# Patient Record
Sex: Female | Born: 1935 | Race: White | Hispanic: No | Smoking: Never smoker
Health system: Southern US, Community
[De-identification: ages and names within clinical notes are randomized; demographics above are authoritative.]

## PROBLEM LIST (undated history)

## (undated) DIAGNOSIS — Z8679 Personal history of other diseases of the circulatory system: Secondary | ICD-10-CM

## (undated) DIAGNOSIS — E663 Overweight: Secondary | ICD-10-CM

## (undated) DIAGNOSIS — I5032 Chronic diastolic (congestive) heart failure: Secondary | ICD-10-CM

## (undated) DIAGNOSIS — R42 Dizziness and giddiness: Secondary | ICD-10-CM

## (undated) DIAGNOSIS — R0609 Other forms of dyspnea: Secondary | ICD-10-CM

## (undated) DIAGNOSIS — R0600 Dyspnea, unspecified: Secondary | ICD-10-CM

## (undated) DIAGNOSIS — Z9889 Other specified postprocedural states: Secondary | ICD-10-CM

## (undated) DIAGNOSIS — N189 Chronic kidney disease, unspecified: Secondary | ICD-10-CM

## (undated) DIAGNOSIS — I2510 Atherosclerotic heart disease of native coronary artery without angina pectoris: Secondary | ICD-10-CM

## (undated) DIAGNOSIS — M810 Age-related osteoporosis without current pathological fracture: Secondary | ICD-10-CM

## (undated) DIAGNOSIS — I639 Cerebral infarction, unspecified: Secondary | ICD-10-CM

## (undated) DIAGNOSIS — I4891 Unspecified atrial fibrillation: Secondary | ICD-10-CM

## (undated) DIAGNOSIS — I10 Essential (primary) hypertension: Secondary | ICD-10-CM

## (undated) DIAGNOSIS — S82143A Displaced bicondylar fracture of unspecified tibia, initial encounter for closed fracture: Secondary | ICD-10-CM

## (undated) HISTORY — DX: Essential (primary) hypertension: I10

## (undated) HISTORY — PX: TOTAL ABDOMINAL HYSTERECTOMY W/ BILATERAL SALPINGOOPHORECTOMY: SHX83

## (undated) HISTORY — DX: Atherosclerotic heart disease of native coronary artery without angina pectoris: I25.10

## (undated) HISTORY — DX: Chronic diastolic (congestive) heart failure: I50.32

## (undated) HISTORY — DX: Unspecified atrial fibrillation: I48.91

## (undated) HISTORY — DX: Other forms of dyspnea: R06.09

## (undated) HISTORY — PX: APPENDECTOMY: SHX54

## (undated) HISTORY — DX: Age-related osteoporosis without current pathological fracture: M81.0

## (undated) HISTORY — DX: Chronic kidney disease, unspecified: N18.9

## (undated) HISTORY — DX: Personal history of other diseases of the circulatory system: Z86.79

## (undated) HISTORY — DX: Overweight: E66.3

## (undated) HISTORY — DX: Dizziness and giddiness: R42

## (undated) HISTORY — DX: Dyspnea, unspecified: R06.00

## (undated) HISTORY — DX: Other specified postprocedural states: Z98.890

---

## 1998-05-13 ENCOUNTER — Encounter: Payer: Self-pay | Admitting: Cardiology

## 1998-05-13 ENCOUNTER — Ambulatory Visit (HOSPITAL_COMMUNITY): Admission: RE | Admit: 1998-05-13 | Discharge: 1998-05-13 | Payer: Self-pay | Admitting: Cardiology

## 1999-06-18 ENCOUNTER — Encounter: Admission: RE | Admit: 1999-06-18 | Discharge: 1999-06-18 | Payer: Self-pay | Admitting: Internal Medicine

## 1999-06-18 ENCOUNTER — Encounter: Payer: Self-pay | Admitting: Internal Medicine

## 1999-06-30 ENCOUNTER — Encounter: Payer: Self-pay | Admitting: Internal Medicine

## 1999-06-30 ENCOUNTER — Encounter: Admission: RE | Admit: 1999-06-30 | Discharge: 1999-06-30 | Payer: Self-pay | Admitting: Internal Medicine

## 2000-06-30 ENCOUNTER — Encounter: Admission: RE | Admit: 2000-06-30 | Discharge: 2000-06-30 | Payer: Self-pay | Admitting: Internal Medicine

## 2000-06-30 ENCOUNTER — Encounter: Payer: Self-pay | Admitting: Internal Medicine

## 2000-07-17 ENCOUNTER — Ambulatory Visit (HOSPITAL_COMMUNITY): Admission: RE | Admit: 2000-07-17 | Discharge: 2000-07-17 | Payer: Self-pay | Admitting: Internal Medicine

## 2000-07-17 ENCOUNTER — Encounter: Payer: Self-pay | Admitting: Internal Medicine

## 2000-08-16 ENCOUNTER — Ambulatory Visit (HOSPITAL_COMMUNITY): Admission: RE | Admit: 2000-08-16 | Discharge: 2000-08-16 | Payer: Self-pay | Admitting: Cardiology

## 2002-08-05 ENCOUNTER — Ambulatory Visit (HOSPITAL_COMMUNITY): Admission: RE | Admit: 2002-08-05 | Discharge: 2002-08-05 | Payer: Self-pay | Admitting: Cardiology

## 2007-01-02 ENCOUNTER — Encounter: Admission: RE | Admit: 2007-01-02 | Discharge: 2007-01-02 | Payer: Self-pay | Admitting: Family Medicine

## 2007-11-05 ENCOUNTER — Ambulatory Visit (HOSPITAL_COMMUNITY): Admission: RE | Admit: 2007-11-05 | Discharge: 2007-11-05 | Payer: Self-pay | Admitting: Cardiology

## 2007-11-27 ENCOUNTER — Ambulatory Visit (HOSPITAL_COMMUNITY): Admission: RE | Admit: 2007-11-27 | Discharge: 2007-11-27 | Payer: Self-pay | Admitting: Cardiology

## 2007-12-10 ENCOUNTER — Encounter: Admission: RE | Admit: 2007-12-10 | Discharge: 2007-12-10 | Payer: Self-pay | Admitting: Cardiology

## 2009-08-14 ENCOUNTER — Encounter: Admission: RE | Admit: 2009-08-14 | Discharge: 2009-08-14 | Payer: Self-pay | Admitting: Internal Medicine

## 2009-08-28 ENCOUNTER — Ambulatory Visit (HOSPITAL_COMMUNITY): Admission: RE | Admit: 2009-08-28 | Discharge: 2009-08-28 | Payer: Self-pay | Admitting: Cardiology

## 2009-09-01 ENCOUNTER — Ambulatory Visit: Payer: Self-pay | Admitting: Thoracic Surgery (Cardiothoracic Vascular Surgery)

## 2009-09-04 ENCOUNTER — Ambulatory Visit (HOSPITAL_COMMUNITY): Admission: RE | Admit: 2009-09-04 | Discharge: 2009-09-04 | Payer: Self-pay | Admitting: Cardiology

## 2009-09-04 ENCOUNTER — Encounter (INDEPENDENT_AMBULATORY_CARE_PROVIDER_SITE_OTHER): Payer: Self-pay | Admitting: Cardiology

## 2009-09-21 ENCOUNTER — Encounter
Admission: RE | Admit: 2009-09-21 | Discharge: 2009-09-21 | Payer: Self-pay | Admitting: Thoracic Surgery (Cardiothoracic Vascular Surgery)

## 2009-09-21 ENCOUNTER — Ambulatory Visit: Payer: Self-pay | Admitting: Thoracic Surgery (Cardiothoracic Vascular Surgery)

## 2009-09-25 ENCOUNTER — Ambulatory Visit: Payer: Self-pay | Admitting: Vascular Surgery

## 2009-10-05 ENCOUNTER — Encounter: Payer: Self-pay | Admitting: Thoracic Surgery (Cardiothoracic Vascular Surgery)

## 2009-10-06 ENCOUNTER — Ambulatory Visit: Payer: Self-pay | Admitting: Thoracic Surgery (Cardiothoracic Vascular Surgery)

## 2009-10-07 ENCOUNTER — Inpatient Hospital Stay (HOSPITAL_COMMUNITY)
Admission: RE | Admit: 2009-10-07 | Discharge: 2009-10-13 | Payer: Self-pay | Admitting: Thoracic Surgery (Cardiothoracic Vascular Surgery)

## 2009-10-07 ENCOUNTER — Ambulatory Visit: Payer: Self-pay | Admitting: Thoracic Surgery (Cardiothoracic Vascular Surgery)

## 2009-10-07 DIAGNOSIS — Z9889 Other specified postprocedural states: Secondary | ICD-10-CM

## 2009-10-07 DIAGNOSIS — Z8679 Personal history of other diseases of the circulatory system: Secondary | ICD-10-CM | POA: Insufficient documentation

## 2009-10-07 HISTORY — DX: Personal history of other diseases of the circulatory system: Z86.79

## 2009-10-07 HISTORY — DX: Other specified postprocedural states: Z98.890

## 2009-10-07 HISTORY — PX: OTHER SURGICAL HISTORY: SHX169

## 2009-10-16 ENCOUNTER — Ambulatory Visit: Payer: Self-pay | Admitting: Thoracic Surgery (Cardiothoracic Vascular Surgery)

## 2009-10-26 ENCOUNTER — Encounter: Admission: RE | Admit: 2009-10-26 | Discharge: 2009-10-26 | Payer: Self-pay | Admitting: Endocrinology

## 2009-10-26 ENCOUNTER — Ambulatory Visit: Payer: Self-pay | Admitting: Thoracic Surgery (Cardiothoracic Vascular Surgery)

## 2009-11-09 ENCOUNTER — Ambulatory Visit: Payer: Self-pay | Admitting: Thoracic Surgery (Cardiothoracic Vascular Surgery)

## 2009-11-12 ENCOUNTER — Encounter
Admission: RE | Admit: 2009-11-12 | Discharge: 2009-11-12 | Payer: Self-pay | Admitting: Thoracic Surgery (Cardiothoracic Vascular Surgery)

## 2009-11-16 ENCOUNTER — Ambulatory Visit: Payer: Self-pay | Admitting: Thoracic Surgery (Cardiothoracic Vascular Surgery)

## 2009-11-26 ENCOUNTER — Encounter
Admission: RE | Admit: 2009-11-26 | Discharge: 2009-11-26 | Payer: Self-pay | Admitting: Thoracic Surgery (Cardiothoracic Vascular Surgery)

## 2009-12-07 ENCOUNTER — Ambulatory Visit: Payer: Self-pay | Admitting: Thoracic Surgery (Cardiothoracic Vascular Surgery)

## 2010-03-22 ENCOUNTER — Ambulatory Visit: Payer: Self-pay | Admitting: Thoracic Surgery (Cardiothoracic Vascular Surgery)

## 2010-08-22 ENCOUNTER — Encounter: Payer: Self-pay | Admitting: Thoracic Surgery (Cardiothoracic Vascular Surgery)

## 2010-09-20 ENCOUNTER — Encounter: Payer: Self-pay | Admitting: Thoracic Surgery (Cardiothoracic Vascular Surgery)

## 2010-09-27 ENCOUNTER — Encounter (INDEPENDENT_AMBULATORY_CARE_PROVIDER_SITE_OTHER): Payer: Medicare PPO | Admitting: Thoracic Surgery (Cardiothoracic Vascular Surgery)

## 2010-09-27 DIAGNOSIS — I4891 Unspecified atrial fibrillation: Secondary | ICD-10-CM

## 2010-09-27 DIAGNOSIS — I059 Rheumatic mitral valve disease, unspecified: Secondary | ICD-10-CM

## 2010-09-27 NOTE — Assessment & Plan Note (Signed)
OFFICE VISIT  MINSA, WEDDINGTON DOB:  [birth year]                                        September 27, 2010 CHART #:  [id number]  HISTORY OF PRESENT ILLNESS:  The patient returns for routine followup now just shy of 1 year status post mitral valve repair and maze procedure on October 07, 2009.  She was last seen here in the office on March 22, 2010.  Since then, the patient has continued to do quite well.  She has been actively participating in the cardiac rehab program and getting along very nicely.  She has mild exertional shortness of breath which has gradually improved with her improved exercise tolerance.  She has not had any tachy palpitations or dizzy spells.  She has not had any chest pain.  She has not had any cardiac related issues to her knowledge whatsoever.  She is scheduled to see Dr. Anne Fu in followup sometime in March.  To her knowledge, she has not had a followup echocardiogram performed in quite some time.  Overall, she seems to be getting along quite nicely and she has no complaints.  CURRENT MEDICATIONS:  Aldactone, Lasix, aspirin, Diovan, potassium chloride, Bystolic, amlodipine, pravastatin, and fish oil capsules.  PHYSICAL EXAMINATION:  GENERAL:  A well-appearing elderly female.  VITAL SIGNS:  Blood pressure 151/79, pulse 78 and regular.  Two-channel telemetry rhythm strip demonstrates normal sinus rhythm.  CHEST: Auscultation of the chest reveals clear breath sounds that are symmetrical bilaterally.  No wheezes, rales, or rhonchi noted. CARDIOVASCULAR:  Notable for regular rate and rhythm.  No murmurs, rubs, or gallops are appreciated.  ABDOMEN:  Soft and nontender.  EXTREMITIES: Warm and well perfused.  There is mild bilateral lower extremity edema which is chronic.  IMPRESSION:  The patient seems to be doing well approximately 1 year following mitral valve repair and maze procedure.  She is maintaining sinus rhythm and clinically  doing nicely.  PLAN:  We will plan to see the patient back in 1 year's time for routine followup and rhythm check.  Her blood pressure is slightly elevated today, but we will not address this and allow this to be reevaluated by Dr. Valentina Lucks, Dr. Charm Barges, and/or Dr. Anne Fu depending upon whether or not her blood pressure stays elevated.  It apparently has been not elevated one that has been checked at the cardiac rehab program.  All of her questions have been addressed.  Salvatore Decent. Cornelius Moras, M.D. Electronically Signed  CHO/MEDQ  D:  09/27/2010  T:  09/27/2010  Job:  [id number]  cc:   Thora Lance, M.D. Leilani Merl, MD

## 2010-10-18 LAB — POCT I-STAT 3, VENOUS BLOOD GAS (G3P V)
Acid-base deficit: 1 mmol/L (ref 0.0–2.0)
Bicarbonate: 22 mEq/L (ref 20.0–24.0)
Bicarbonate: 24.8 mEq/L — ABNORMAL HIGH (ref 20.0–24.0)
O2 Saturation: 53 %
O2 Saturation: 62 %
O2 Saturation: 64 %
pCO2, Ven: 40.1 mmHg — ABNORMAL LOW (ref 45.0–50.0)
pCO2, Ven: 41.6 mmHg — ABNORMAL LOW (ref 45.0–50.0)
pH, Ven: 7.374 — ABNORMAL HIGH (ref 7.250–7.300)
pH, Ven: 7.379 — ABNORMAL HIGH (ref 7.250–7.300)
pH, Ven: 7.393 — ABNORMAL HIGH (ref 7.250–7.300)
pO2, Ven: 33 mmHg (ref 30.0–45.0)
pO2, Ven: 33 mmHg (ref 30.0–45.0)

## 2010-10-18 LAB — POCT I-STAT 3, ART BLOOD GAS (G3+)
O2 Saturation: 92 %
pH, Arterial: 7.408 — ABNORMAL HIGH (ref 7.350–7.400)
pO2, Arterial: 64 mmHg — ABNORMAL LOW (ref 80.0–100.0)

## 2010-10-18 LAB — PROTIME-INR: INR: 1.65 — ABNORMAL HIGH (ref 0.00–1.49)

## 2010-10-25 LAB — COMPREHENSIVE METABOLIC PANEL
ALT: 15 U/L (ref 0–35)
AST: 18 U/L (ref 0–37)
Alkaline Phosphatase: 91 U/L (ref 39–117)
CO2: 21 mEq/L (ref 19–32)
Chloride: 113 mEq/L — ABNORMAL HIGH (ref 96–112)
GFR calc Af Amer: >60 mL/min (ref >60)
GFR calc non Af Amer: 52 mL/min — ABNORMAL LOW (ref >60)
Glucose, Bld: 104 mg/dL — ABNORMAL HIGH (ref 70–99)
Potassium: 4.5 mEq/L (ref 3.5–5.1)
Sodium: 143 mEq/L (ref 135–145)
Total Bilirubin: 1.1 mg/dL (ref 0.3–1.2)

## 2010-10-25 LAB — CBC
HCT: 31.4 % — ABNORMAL LOW (ref 36.0–46.0)
HCT: 32.1 % — ABNORMAL LOW (ref 36.0–46.0)
HCT: 34.2 % — ABNORMAL LOW (ref 36.0–46.0)
HCT: 35.4 % — ABNORMAL LOW (ref 36.0–46.0)
Hemoglobin: 10.6 g/dL — ABNORMAL LOW (ref 12.0–15.0)
Hemoglobin: 10.8 g/dL — ABNORMAL LOW (ref 12.0–15.0)
Hemoglobin: 11.6 g/dL — ABNORMAL LOW (ref 12.0–15.0)
Hemoglobin: 11.9 g/dL — ABNORMAL LOW (ref 12.0–15.0)
Hemoglobin: 14.3 g/dL (ref 12.0–15.0)
MCHC: 33.6 g/dL (ref 30.0–36.0)
MCHC: 33.7 g/dL (ref 30.0–36.0)
MCHC: 33.8 g/dL (ref 30.0–36.0)
MCV: 88.2 fL (ref 78.0–100.0)
MCV: 88.3 fL (ref 78.0–100.0)
MCV: 88.9 fL (ref 78.0–100.0)
MCV: 89.2 fL (ref 78.0–100.0)
Platelets: 178 10*3/uL (ref 150–400)
Platelets: 81 10*3/uL — ABNORMAL LOW (ref 150–400)
Platelets: 85 10*3/uL — ABNORMAL LOW (ref 150–400)
Platelets: 98 10*3/uL — ABNORMAL LOW (ref 150–400)
RBC: 3.53 MIL/uL — ABNORMAL LOW (ref 3.87–5.11)
RBC: 3.59 MIL/uL — ABNORMAL LOW (ref 3.87–5.11)
RBC: 3.83 MIL/uL — ABNORMAL LOW (ref 3.87–5.11)
RBC: 4.01 MIL/uL (ref 3.87–5.11)
RBC: 4.93 MIL/uL (ref 3.87–5.11)
RDW: 15.1 % (ref 11.5–15.5)
RDW: 15.4 % (ref 11.5–15.5)
RDW: 15.9 % — ABNORMAL HIGH (ref 11.5–15.5)
WBC: 11 10*3/uL — ABNORMAL HIGH (ref 4.0–10.5)
WBC: 13.9 10*3/uL — ABNORMAL HIGH (ref 4.0–10.5)
WBC: 14.6 10*3/uL — ABNORMAL HIGH (ref 4.0–10.5)
WBC: 19.1 10*3/uL — ABNORMAL HIGH (ref 4.0–10.5)
WBC: 20.6 10*3/uL — ABNORMAL HIGH (ref 4.0–10.5)
WBC: 8.9 10*3/uL (ref 4.0–10.5)

## 2010-10-25 LAB — BASIC METABOLIC PANEL
BUN: 15 mg/dL (ref 6–23)
BUN: 21 mg/dL (ref 6–23)
CO2: 23 mEq/L (ref 19–32)
CO2: 30 mEq/L (ref 19–32)
Calcium: 7.7 mg/dL — ABNORMAL LOW (ref 8.4–10.5)
Chloride: 101 mEq/L (ref 96–112)
Chloride: 109 mEq/L (ref 96–112)
Chloride: 117 mEq/L — ABNORMAL HIGH (ref 96–112)
Creatinine, Ser: 0.96 mg/dL (ref 0.4–1.2)
Creatinine, Ser: 1.32 mg/dL — ABNORMAL HIGH (ref 0.4–1.2)
Creatinine, Ser: 1.39 mg/dL — ABNORMAL HIGH (ref 0.4–1.2)
GFR calc Af Amer: 39 mL/min — ABNORMAL LOW (ref >60)
GFR calc Af Amer: 47 mL/min — ABNORMAL LOW (ref >60)
GFR calc Af Amer: >60 mL/min (ref >60)
GFR calc Af Amer: >60 mL/min (ref >60)
GFR calc non Af Amer: 32 mL/min — ABNORMAL LOW (ref >60)
GFR calc non Af Amer: 37 mL/min — ABNORMAL LOW (ref >60)
GFR calc non Af Amer: 57 mL/min — ABNORMAL LOW (ref >60)
Glucose, Bld: 102 mg/dL — ABNORMAL HIGH (ref 70–99)
Glucose, Bld: 106 mg/dL — ABNORMAL HIGH (ref 70–99)
Potassium: 3.6 mEq/L (ref 3.5–5.1)
Potassium: 3.7 mEq/L (ref 3.5–5.1)
Potassium: 3.7 mEq/L (ref 3.5–5.1)
Potassium: 4.7 mEq/L (ref 3.5–5.1)
Sodium: 136 mEq/L (ref 135–145)
Sodium: 141 mEq/L (ref 135–145)
Sodium: 142 mEq/L (ref 135–145)
Sodium: 145 mEq/L (ref 135–145)

## 2010-10-25 LAB — POCT I-STAT 3, ART BLOOD GAS (G3+)
Acid-base deficit: 2 mmol/L (ref 0.0–2.0)
Acid-base deficit: 3 mmol/L — ABNORMAL HIGH (ref 0.0–2.0)
Acid-base deficit: 4 mmol/L — ABNORMAL HIGH (ref 0.0–2.0)
Acid-base deficit: 5 mmol/L — ABNORMAL HIGH (ref 0.0–2.0)
Bicarbonate: 23.1 mEq/L (ref 20.0–24.0)
O2 Saturation: 100 %
O2 Saturation: 96 %
Patient temperature: 36.8
Patient temperature: 36.9
TCO2: 23 mmol/L (ref 0–100)
pCO2 arterial: 35 mmHg (ref 35.0–45.0)
pCO2 arterial: 37.9 mmHg (ref 35.0–45.0)

## 2010-10-25 LAB — POCT I-STAT 4, (NA,K, GLUC, HGB,HCT)
Glucose, Bld: 105 mg/dL — ABNORMAL HIGH (ref 70–99)
Glucose, Bld: 107 mg/dL — ABNORMAL HIGH (ref 70–99)
Glucose, Bld: 88 mg/dL (ref 70–99)
HCT: 26 % — ABNORMAL LOW (ref 36.0–46.0)
HCT: 26 % — ABNORMAL LOW (ref 36.0–46.0)
HCT: 26 % — ABNORMAL LOW (ref 36.0–46.0)
HCT: 33 % — ABNORMAL LOW (ref 36.0–46.0)
HCT: 34 % — ABNORMAL LOW (ref 36.0–46.0)
Hemoglobin: 11.2 g/dL — ABNORMAL LOW (ref 12.0–15.0)
Hemoglobin: 11.6 g/dL — ABNORMAL LOW (ref 12.0–15.0)
Hemoglobin: 8.8 g/dL — ABNORMAL LOW (ref 12.0–15.0)
Hemoglobin: 8.8 g/dL — ABNORMAL LOW (ref 12.0–15.0)
Potassium: 3.7 mEq/L (ref 3.5–5.1)
Potassium: 3.7 mEq/L (ref 3.5–5.1)
Potassium: 3.8 mEq/L (ref 3.5–5.1)
Sodium: 139 mEq/L (ref 135–145)
Sodium: 142 mEq/L (ref 135–145)
Sodium: 143 mEq/L (ref 135–145)

## 2010-10-25 LAB — GLUCOSE, CAPILLARY
Glucose-Capillary: 112 mg/dL — ABNORMAL HIGH (ref 70–99)
Glucose-Capillary: 114 mg/dL — ABNORMAL HIGH (ref 70–99)
Glucose-Capillary: 135 mg/dL — ABNORMAL HIGH (ref 70–99)
Glucose-Capillary: 149 mg/dL — ABNORMAL HIGH (ref 70–99)
Glucose-Capillary: 160 mg/dL — ABNORMAL HIGH (ref 70–99)
Glucose-Capillary: 96 mg/dL (ref 70–99)

## 2010-10-25 LAB — MAGNESIUM: Magnesium: 2.4 mg/dL (ref 1.5–2.5)

## 2010-10-25 LAB — POCT I-STAT GLUCOSE
Glucose, Bld: 92 mg/dL (ref 70–99)
Operator id: 3406

## 2010-10-25 LAB — URINALYSIS, ROUTINE W REFLEX MICROSCOPIC
Glucose, UA: NEGATIVE mg/dL
Ketones, ur: NEGATIVE mg/dL
Nitrite: POSITIVE — AB
pH: 6 (ref 5.0–8.0)

## 2010-10-25 LAB — POCT I-STAT, CHEM 8
BUN: 20 mg/dL (ref 6–23)
Calcium, Ion: 1.08 mmol/L — ABNORMAL LOW (ref 1.12–1.32)
HCT: 35 % — ABNORMAL LOW (ref 36.0–46.0)
TCO2: 22 mmol/L (ref 0–100)

## 2010-10-25 LAB — URINE MICROSCOPIC-ADD ON

## 2010-10-25 LAB — TYPE AND SCREEN: Antibody Screen: NEGATIVE

## 2010-10-25 LAB — PROTIME-INR
INR: 1.6 — ABNORMAL HIGH (ref 0.00–1.49)
INR: 1.64 — ABNORMAL HIGH (ref 0.00–1.49)
INR: 2.01 — ABNORMAL HIGH (ref 0.00–1.49)
Prothrombin Time: 19.3 seconds — ABNORMAL HIGH (ref 11.6–15.2)
Prothrombin Time: 20.1 seconds — ABNORMAL HIGH (ref 11.6–15.2)

## 2010-10-25 LAB — BLOOD GAS, ARTERIAL
Bicarbonate: 21.8 mEq/L (ref 20.0–24.0)
FIO2: 0.21 %
O2 Saturation: 93.4 %
pCO2 arterial: 33.3 mmHg — ABNORMAL LOW (ref 35.0–45.0)
pO2, Arterial: 76.1 mmHg — ABNORMAL LOW (ref 80.0–100.0)

## 2010-10-25 LAB — SURGICAL PCR SCREEN: MRSA, PCR: NEGATIVE

## 2010-10-25 LAB — APTT: aPTT: 39 seconds — ABNORMAL HIGH (ref 24–37)

## 2010-10-25 LAB — ABO/RH: ABO/RH(D): A POS

## 2010-10-25 LAB — CREATININE, SERUM: GFR calc Af Amer: >60 mL/min (ref >60)

## 2010-10-25 LAB — PLATELET COUNT: Platelets: 128 10*3/uL — ABNORMAL LOW (ref 150–400)

## 2010-10-25 LAB — HEMOGLOBIN A1C: Hgb A1c MFr Bld: 5.7 % (ref 4.6–6.1)

## 2010-10-25 LAB — MRSA PCR SCREENING: MRSA by PCR: NEGATIVE

## 2010-12-14 NOTE — Procedures (Signed)
VASCULAR LAB EXAM   INDICATION:  Left groin AVF.   HISTORY:  Diabetes:  No.  Cardiac:  MI.  Hypertension:  Yes.   EXAM:  Left groin duplex.   IMPRESSION:  1. Evidence of left AVF (arteriovenous fistula) that appears to      connect proximal superficial femoral artery to common femoral vein.  2. Elevated velocities of >600 cm/s at the site of AVF.  3. Arterial and venous flow appear within normal limits proximal and      distal to the location of the AVF.   ___________________________________________  Larina Earthly, M.D.   AS/MEDQ  D:  09/25/2009  T:  09/25/2009  Job:  [id number]

## 2010-12-14 NOTE — Assessment & Plan Note (Signed)
OFFICE VISIT   Tami Ramirez, Tami Ramirez  DOB:  Mar 22, 1929                                        October 16, 2009  CHART #:  [id number]   HISTORY OF PRESENT ILLNESS:  The patient presents to the office today  for followup status post right miniature thoracotomy for mitral valve  repair with Cox-CryoMaze procedure and ligation of left femoral AV  fistula on October 07, 2009.  Her postoperative recovery in the hospital  was remarkably uncomplicated, and she was discharged to a skilled  nursing facility last week.  She was scheduled to see [place] in the office  this coming Monday, but she was brought in today due to concerns  regarding her left groin incision.  She reportedly is doing reasonably  well at the nursing facility.  She states that they are getting her up  to walk her at least twice a day.  She still does not feel terribly well  and she still has some soreness in her chest and her left groin.  She  reportedly had a low grade fever of 99 degrees yesterday.  She has never  had any fever higher than that.  She has still been eating alright.  Her  bowel function is normal.  She is sleeping fairly well at night.  She  denies any cough.  She has not had any problems breathing.  Her  prothrombin time has been drawn and checked and reportedly her INR was  therapeutic.  Medications remain unchanged from the time of hospital  discharge.   PHYSICAL EXAMINATION:  Notable for well-appearing elderly female with  blood pressure 108/63, pulse 72 and regular, temperature 98.4 degrees.  Examination of the chest reveals a mini thoracotomy incision that is  healing nicely.  Her chest tube sutures are removed.  Auscultation of  the chest reveals clear breath sounds which are overall fairly  symmetrical bilaterally.  No wheezes, rales, or rhonchi are noted.  Cardiovascular exam demonstrates regular rate and rhythm.  No murmurs,  rubs, or gallops are appreciated.  The abdomen is soft,  nontender.  The  extremities are warm and well perfused.  The left groin incision is  mildly erythematous but overall intact.  On palpation, there is mild  tenderness and a very small amount of cloudy drainage can be expressed  medially.  There is no fluctuance on exam to suggest a soft tissue fluid  collection.  The drainage is cultured with a routine wound swab and a  dry dressing was applied after cleansing the wound.   IMPRESSION:  Overall, I think the patient looks good.  She may be  developing early signs of a left groin wound infection, but overall the  wound appears intact and there is certainly no indication that it needs  to be a opened or debrided.  Otherwise, she seems to be doing fairly  well.   PLAN:  I have given the patient a prescription for Keflex 500 mg p.o.  three times daily.  We will follow up on her wound swab culture early  next week.  We will make sure that they are cleansing her wound daily at  the nursing facility.  We have continued to encourage increased physical  activity.  We will plan to see her back on Monday, March 28, with a  chest x-ray.  Her family has been instructed to bring her back sooner if  she has any further problems or if her groin wound becomes more  erythematous or starts draining.   Salvatore Decent. Cornelius Moras, M.D.  Electronically Signed   CHO/MEDQ  D:  10/16/2009  T:  10/17/2009  Job:  [id number]   cc:   Thora Lance, M.D.  Corliss Marcus, MD

## 2010-12-14 NOTE — Assessment & Plan Note (Signed)
OFFICE VISIT   Tami Ramirez, Tami Ramirez  DOB:  [birth year]                                        March 22, 2010  CHART #:  [id number]   HISTORY OF PRESENT ILLNESS:  The patient returns for routine followup  and rhythm check status post right miniature thoracotomy for mitral  valve repair and Cox CryoMaze procedure on October 07, 2009.  She was last  seen here in the office on Dec 07, 2009.  Since then, she has continued  to do very well.  She was seen in followup by Dr. Amil Amen on February 24, 2010.  She was taken off of amiodarone at that time and she has  continued to do very well since then.  She returns to our office for  further followup today.  She reports that her breathing remains much  better than it was prior to her surgery.  She still gets short of breath  with activity, but much less than she did prior to surgery.  She has not  had any exertional chest pain.  She still has some soreness in her right  lateral chest wall related to her mini thoracotomy.  This is gradually  improving.  She previously had numbness and pain involving her left hand  and forearm.  These symptoms have now completely resolved with exception  of a very mild amount of numbness involving her left thumb.  She  otherwise feels well.  She admits that she does not exercise as much as  she should be, but she is getting around without any significant  limitation.  She is quite stable overall and has not had any new medical  problems or issues for the last few months.  The remainder of her review  of systems is unremarkable.   CURRENT MEDICATIONS:  Bystolic, aspirin, warfarin, Lasix, potassium  chloride, amlodipine, pravastatin, and omega-3 fatty acid complex.   PHYSICAL EXAMINATION:  Notable for a well-appearing, moderately obese  elderly female with blood pressure 151/79, pulse 78 and regular, and  oxygen saturation 93% on room air.  Two-channel telemetry rhythm strip  demonstrates normal  sinus rhythm.  Examination of the chest reveals  clear breath sounds that are symmetrical bilaterally.  No wheezes,  rales, or rhonchi are noted.  Cardiovascular exam is notable for regular  rate and rhythm.  No murmurs, rubs, or gallops are noted.  The abdomen  is soft and nontender.  The left groin incision healed completely.  There is moderate bilateral lower extremity edema that is stable and  chronic.  No other abnormalities are noted.   IMPRESSION:  The patient appears to be doing well and maintaining sinus  rhythm.   PLAN:  I think if she continues to remain in sinus rhythm for the next 6  weeks, it would be reasonable to consider stopping Coumadin now that she  has been taken off of amiodarone.  A followup CardioNet monitor or  Holter monitor might be helpful to confirm the absence of any  significant episodes of atrial fibrillation, but clinically she is doing  very well.  We have not made any changes in her medications and we will  defer any subsequent decisions to Dr. Amil Amen and colleagues.  We will  plan to see the patient back for further followup and rhythm check in 6  months.  Salvatore Decent. Cornelius Moras, M.D.  Electronically Signed   CHO/MEDQ  D:  03/22/2010  T:  03/23/2010  Job:  [id number]   cc:   Francisca December, M.D.  Thora Lance, M.D.  Melvyn Novas, M.D.

## 2010-12-14 NOTE — Assessment & Plan Note (Signed)
OFFICE VISIT   Tami, Ramirez  DOB:  1928/08/18                                        November 16, 2009  CHART #:  [id number]   HISTORY:  The patient returns to the office today for further follow up.  She was last seen here in the office on November 09, 2009.  At that time,  she was reporting numbness involving her left hand and forearm.  We  ordered MRI and MRA of the brain to rule out a possible stroke.  Unfortunately, only an MRA was performed and an MRI was not performed.  The MRA did not reveal any high-grade intracranial arterial stenosis.  Last week, we also noted that she had a small open wound in her groin  and we ordered daily home health nursing visits for wound care.  The  patient and her daughter report that the home health agency told them  that they could not come out every day.  They came out this morning, but  the last time prior to that was last Thursday.  The patient is otherwise  doing fairly well.  She does not bring her medications with her to the  office and she does not carry a list of her current medications.  She  has been seen in follow up both by Dr. Valentina Lucks and Dr. Amil Amen.  Apparently, her blood pressure medications have been changed and she has  been given a new medication with some samples.  The remainder of her  review of systems is unremarkable.   PHYSICAL EXAMINATION:  Notable for a well-appearing female.  Blood  pressure 191/92 and on repeat is 174/88.  Pulse is 84 and regular,  oxygen saturation 92% on room air.  Auscultation of the chest reveals  clear breath sounds.  Cardiovascular exam is notable for the absence of  any murmur.  The left groin incision is clean and granulating nicely.  The wound is small and overall healing up fine.  There is mild lower  extremity edema.   ASSESSMENT AND PLAN:  We will again try to obtain MRI of the brain to  rule out stroke.  We will go ahead and ask for neurology consult so that  she can have an appointment to be seen by a neurologist once her MRI has  been completed.  She will need physical therapy and occupational therapy  because of her left hand numbness.  We will make sure that she is  getting daily wound care for her groin wound.  We will plan to see her  back in 3 weeks.  We have not made any changes in her current  medications, but we have instructed her and her daughter to  keep an eye on her blood pressure and make sure to keep a list of her  current medications with her at all time.   Salvatore Decent. Cornelius Moras, M.D.  Electronically Signed   CHO/MEDQ  D:  11/16/2009  T:  11/17/2009  Job:  [id number]   cc:   Francisca December, M.D.  Thora Lance, M.D.

## 2010-12-14 NOTE — Assessment & Plan Note (Signed)
OFFICE VISIT   YAN, OKRAY  DOB:  01/16/1929                                        November 09, 2009  CHART #:  [id number]   REASON FOR OFFICE VISIT:  Left groin wound check.   HISTORY OF PRESENTING ILLNESS:  The patient is status post right  miniature thoracotomy for mitral valve repair, Cox CryoMaze procedure  (left side), and ligation of left femoral AV fistula by Dr. Cornelius Moras on  October 07, 2009.  The patient was last seen in the office on October 26, 2009.  At that time, she continued with a small amount of intermittent  although decreasing drainage from the left groin.  She had previously  been started on Keflex on October 16, 2009, and was given a 2-week  prescription on this last office visit.  She is almost finished with the  2-week treatment.  She is accompanied to her office visit by her  daughter.  Her other complaint is minimal residual soreness on her right  chest.  She denies any shortness of breath, nauseousness, vomiting,  fever, or chills.  She is recently discharged from the facility and is  now at home and home health just initiated their visit today.  She had  her PT and INR drawn today, the results are apparently going to be faxed  to Dr. Amil Amen' office who will continue to adjust her Coumadin  accordingly.  The patient states that the home health nurse did not look  at the left groin as she did not mention it to them.  The patient also  reports numbness of her left arm, hand, and all 5 fingers.  She is  uncertain as to when this exactly started but it is fairly recent.  She  denies any difficulties with speech, any visual changes, or difficulties  with vision.  Denies any lower extremity numbness.   PHYSICAL EXAMINATION:  General:  This is a pleasant Caucasian female who  is in no acute distress who is alert, oriented, and cooperative.  Her  latest vital signs are as follows:  BP 153/80, pulse rate 78,  respirations 18, O2 sat 91% on room  air.  Right chest wound is clean,  dry, and well healed.  No sign of infection.  Left groin wound, a small  piece of stitch was removed from the corner of the wound.  There is a  tiny area of the left groin that is open.  There was some fluffy tissue,  this was gently debrided.  A Q-tip was used to probe the depth of the  wound.  It is not very deep, more superficial (no real surrounding  erythema, no fluctuance or drainage upon palpation).  Neurologic:  Cranial nerves II through XII are grossly intact without any focal  deficits.  The patient does have complaints as previously stated of  numbness and tingling down her entire left arm, fingers, and hand.  No  obvious weakness from her right upper extremity.  No lower extremity  weakness bilaterally.   IMPRESSION AND PLAN:  We are going to contact home health to arrange for  normal saline damp-to-dry dressing to the left groin wound to be changed  daily.  She was instructed to finish her Keflex and at this time  requires no further antibiotics.  She was instructed to  contact the  office if she develops any redness, drainage, fever, or chills.  She is  going to return to see Dr. Cornelius Moras in 1 week for follow up.  As previously  stated, the patient's PT and INR were continued to be monitored by Dr.  Amil Amen' office.  Finally, regarding the patient's new left-sided  numbness and tingling of the left upper extremity, we are going to  obtain an MRI/MRA of the brain to rule out CVA.  The patient was  instructed she is not to participate in outpatient rehab or drive until  instructed otherwise.  The patient was seen and evaluated by both myself  and Dr. Cornelius Moras.   Salvatore Decent. Cornelius Moras, M.D.  Electronically Signed   DZ/MEDQ  D:  11/09/2009  T:  11/10/2009  Job:  [id number]   cc:   Francisca December, M.D.  Thora Lance, M.D.

## 2010-12-14 NOTE — H&P (Signed)
HISTORY AND PHYSICAL EXAMINATION   September 01, 2009   Re:  Tami Ramirez, Tami Ramirez         DOB:  1929-06-12   REQUESTING PHYSICIAN:  Francisca December, MD   PRESENTING CHIEF COMPLAINT:  Shortness of breath.   HISTORY OF PRESENT ILLNESS:  The patient is a 77 year old widowed white  female from [place], [place], with long-standing history of  progressive symptoms of congestive heart failure and recently discovered  severe mitral regurgitation and atrial fibrillation.  The patient's  cardiac history dates back to 1997 when she suffered an anterior wall  myocardial infarction.  She was treated with percutaneous coronary  intervention and stent placement in the left anterior descending  coronary artery.  She has been followed by Dr. Corliss Marcus for many  years.  For the last several years, she had developed gradual  progression of symptoms of exertional shortness of breath and bilateral  lower extremity edema.  She underwent followup cardiac catheterization  in April 2009 demonstrating patent stent in the left anterior descending  coronary artery with otherwise nonobstructive coronary artery disease.  Overall, left ventricular ejection fraction was estimated 60%, although  there was focal apical dyskinesis consistent with the patient's remote  history of anteroapical myocardial infarction.  She was treated  medically.  The patient states that over the last several months she has  had a fairly rapid decline in her functional status with progressive  exertional shortness of breath with intervening periods of resting  shortness of breath as well as orthopnea and bilateral lower extremity  edema.  She returned to see Dr. Amil Amen and was noted to be in atrial  fibrillation this past week.  A followup echocardiogram was performed in  his office on August 14, 2009.  There was severe mitral regurgitation  with severe left atrial enlargement.  Left ventricular ejection  fraction  was estimated 45-50%.  There was moderate tricuspid regurgitation as  well as significant diastolic dysfunction related to left ventricular  hypertrophy.  The patient subsequently underwent left and right heart  catheterization by Dr. Amil Amen on August 28, 2009.  The stent in the  left anterior descending coronary artery remained widely patent and  there was otherwise no significant flow-limiting coronary artery disease  appreciated.  There was significant pulmonary hypertension with PA  pressures measured 46/27, pulmonary capillary wedge pressure of 26 with  a large V-wave.  Left ventricular end-diastolic pressure was 21.  Cardiac output was measured at 3.7 L per minute corresponding to a  cardiac index of 2.2.  Central venous pressure was 14.  Left  ventriculogram demonstrated mild left ventricular chamber dilatation  with significant anterior apical akinesis or dyskinesis.  Ejection  fraction was estimated 30-35%.  There was severe mitral regurgitation.  The patient has been referred to consider elective mitral valve repair.   REVIEW OF SYSTEMS:  GENERAL:  The patient reports normal appetite.  She  has not been gaining nor losing weight recently.  She is 5 feet 2 inches  tall and weighs approximately 147 pounds.  CARDIAC:  Notable for progressive symptoms of exertional shortness of  breath and fatigue.  The patient has orthopnea and sleeps on at least 2  pillows at night.  She has had severe bilateral lower extremity edema,  although this has improved over the last couple weeks on increased  diuretic therapy.  She has had some chest tightness which seems to come  and go and is not associated with physical exertion.  She has not had  any dizzy spells or syncopal episodes.  RESPIRATORY:  Notable for shortness of breath.  The patient denies  productive cough, hemoptysis, wheezing.  She has been started on oxygen  therapy at home since her heart catheterization and this seems  to help  her symptomatically.  GASTROINTESTINAL:  Negative.  The patient has no difficulty swallowing.  She denies hematochezia, hematemesis, melena.  Bowel function is  regular.  GENITOURINARY:  Negative.  The patient denies urinary urgency or  dysuria.  PERIPHERAL VASCULAR:  Negative.  The patient denies symptoms suggestive  of claudication.  NEUROLOGIC:  Negative.  The patient denies symptoms suggestive of  previous stroke.  MUSCULOSKELETAL:  Notable for mild arthralgias, but overall the patient  is not limited physically.  PSYCHIATRIC:  Negative.  HEENT:  Negative.  INFECTIOUS:  Negative.   PAST MEDICAL HISTORY:  1. Mitral regurgitation, recently diagnosed.  2. Congestive heart failure, chronic systolic and diastolic.  3. Coronary artery disease with ischemic cardiomyopathy, status post      acute anterior-apical myocardial infarction in 1997.  4. Hypertension.  5. Atrial fibrillation, recent onset, paroxysmal.   PAST SURGICAL HISTORY:  1. Hysterectomy.  2. Appendectomy.   FAMILY HISTORY:  Noncontributory.   SOCIAL HISTORY:  The patient is widowed and been living alone for many  years.  She has 5 grown children and numerous grandchildren and her  family is very supportive.  She has remained active physically, care for  herself, drives an automobile, and has been looking after several young  children during the days on a part-time basis.  She is a nonsmoker.  She  denies any alcohol consumption.   CURRENT MEDICATIONS:  1. Simvastatin 40 mg daily.  2. Valturna 300/320 one-half tablet daily.  3. Amlodipine 10 mg daily.  4. Bystolic 10 mg daily.  5. Torsemide 10 mg daily.  6. Potassium chloride 20 mEq daily.  7. Aspirin 81 mg daily.  8. Warfarin 5 mg daily.  9. Amiodarone 400 mg daily.   DRUG ALLERGIES:  Penicillin causes rash, Darvocet causes hallucinations,  codeine causes nausea, lisinopril causes cough.   PHYSICAL EXAMINATION:  The patient is a mildly obese,  somewhat frail-  appearing female who appears her stated age in no acute distress.  Pulse  is 62 and irregular, blood pressure 136/72, oxygen saturation is 94% on  oxygen 3 L nasal cannula.  HEENT exam is unrevealing.  The neck is  supple.  There is no cervical nor supraclavicular lymphadenopathy.  There is no jugular venous distention.  No carotid bruits are noted.  Auscultation of the chest demonstrates a few inspiratory crackles at the  lung bases, but overall lung fields are fairly clear.  Cardiovascular  exam is notable for irregular heart rhythm.  There is a grade 2-3/6  systolic murmur heard best at the apex.  No diastolic murmurs are noted.  The abdomen is moderately obese but soft and nontender.  There is no  obvious ascites.  Bowel sounds are present.  The extremities are warm  and well perfused.  There is moderate bilateral lower extremity edema  extending two-third the way up towards the knees.  There are some  changes of chronic venous insufficiency, but the skin is clean and dry,  healthy appearing throughout.  Distal pulses are not palpable in either  lower leg at the ankle.  Femoral cath site in the right groin appears  intact.  Left femoral pulse is barely palpable.  Rectal and GU  exams  both deferred.  Neurologic examination is grossly nonfocal and  symmetrical throughout.   DIAGNOSTIC TESTS:  Transthoracic echocardiogram performed at [facility] on August 14, 2009, is reviewed with Dr. Amil Amen.  This  clearly demonstrates severe (4+) mitral regurgitation.  There is a broad  central jet of regurgitation that fills the left atrium.  This appears  to be due to functional restriction of the posterior leaflet and/or  annular dilatation.  There are no obvious areas of mitral valve prolapse  appreciated.  The presence or absence of calcification cannot be  ascertained and functional anatomy is somewhat confusing based upon the  patient's left ventricular function.  The  left ventricle is certainly  dilated but ejection fraction does not look terrible.  The left atrium  is enlarged.  The aortic valve has mild sclerosis, but there is no  aortic stenosis and no significant aortic regurgitation.  There was  moderate tricuspid regurgitation.  No other abnormalities are noted.   Left and right heart catheterization performed on August 28, 2009, by  Dr. Amil Amen is reviewed.  Findings are as discussed previously.  Interestingly, this cath is also compared with the cath from April 2009.  The more recent cath shows enlarged left ventricular chamber with more  dyskinesis of the distal anterior wall and apex that is almost  suggestive of possibility of the left ventricular aneurysm.  There is  clearly severe mitral regurgitation.  No other abnormalities are noted.   IMPRESSION:  Severe mitral regurgitation with apparently recent  development of paroxysmal atrial fibrillation accompanied by fairly  rapid decline in functional status due to progressive congestive heart  failure.  The patient would certainly be at increased risk for surgical  intervention due to her advanced age and other comorbid medical problems  as well as underlying left ventricular dysfunction.  Nonetheless, she  does not have a lot of other medical problems and long-term prognosis  with continued medical therapy would be relatively poor.  The precise  mechanism of her mitral regurgitation remains a bit unclear as she does  not have classical presentation for a patient with ischemic mitral  regurgitation.  The appearance of her left ventricle on recent  catheterization raises the possibility that she might in fact have left  ventricular aneurysm.  Transesophageal echocardiogram might be very  helpful to sort this out further and better ascertain the mechanism for  her mitral regurgitation.  If not we might consider a cardiac MRI to  rule out a left ventricular aneurysm.   PLAN:  We will make  arrangements for the patient to have transesophageal  echocardiogram through Dr. Amil Amen' office as soon as practical.  We  will also send her for CT angiogram of the thoracic abdominal aorta and  iliac vessels to evaluate the severity of her underlying aortoiliac  occlusive disease.  We will plan to see her back on September 21, 2009,  for further follow up.  All of their questions have been addressed.    Tami Ramirez, M.D.  Electronically Signed   CHO/MEDQ  D:  09/01/2009  T:  09/02/2009  Job:  [id number]   cc:   Francisca December, M.D.  Thora Lance, M.D.

## 2010-12-14 NOTE — Assessment & Plan Note (Signed)
OFFICE VISIT   Tami Ramirez, Tami Ramirez  DOB:  01/15/1929                                       09/25/2009  [id number]#:[id number]   Patient presents today for evaluation of recently discovered a left  groin AV fistula.  She is a very pleasant 77 year old white female with  progressively severe mitral regurgitation and atrial fibrillation.  She  is being considered for percutaneous valve replacement by Dr. Tressie Stalker..  She had undergone a CT angiogram to determine the level of  atherosclerotic disease and to determine if she was a candidate for  percutaneous treatment.  This showed an incidental finding of a left  femoral AV fistula.  I am seeing her for further discussion.   Patient has an extensive past history of coronary disease and has  undergone multiple prior coronary interventions.  She does not  specifically require any groin complications on the left.  She did have  a groin complication on the right leg in the past requiring a femoral  repair.  She does not have any leg swelling bilaterally.  She does not  have any neck pain bilaterally.   Past history is significant for hypertension, prior history of  myocardial infarction in 1997, a history of congestive failure, has a  past history of hysterectomy, appendectomy, left knee surgery.   FAMILY HISTORY:  Negative for premature atherosclerotic disease.   SOCIAL HISTORY:  She is widowed with 5 children.  She does not smoke or  drink alcohol.   REVIEW OF SYSTEMS:  No weight loss or weight gain.  Her weight is 148  pounds.  She is 5 feet 2 inches tall.  CARDIAC:  Positive for tightness, shortness of breath with lying flat  and exertion, and atrial fibrillation.  PULMONARY:  She does use some home oxygen.  GI:  Negative.  GU:  Does have frequent urinary frequency.  VASCULAR:  Negative.  NEUROLOGIC:  No strokes or seizures.  MUSCULOSKELETAL:  No arthritic or joint pain.  PSYCHIATRIC:  No depression or  anxiety.  HEENT:  No change in eyesight or hearing.  HEMATOLOGIC:  No bleeding or clotting disorders.  SKIN:  No chronic rash.   PHYSICAL EXAMINATION:  A well-developed and well-nourished white female  appearing her stated age of 83.  She is in no acute distress.  HEENT is  normal.  Chest is clear bilaterally without wheezes.  Cardiovascular:  An irregular rhythm.  She does have 2+ radial, 2+ femoral, and 2+  dorsalis pedis pulses bilaterally.  She does not have any significant  leg swelling.  Abdominal exam is soft, nontender.  No masses.  Musculoskeletal:  No major deformities or cyanosis.  Neurologic:  No  focal weakness or paresthesias.  Skin:  Without ulcers or rashes.   She did undergo a duplex evaluation in our office, and I did review her  CT scans.  CT scan shows early filling of her left femoral vein.  On  duplex of her left groin, she does have a communication.  It appears to  be in the proximal superficial femoral artery to the common femoral  vein.  The vein just above this has near-normal flow characteristics,  suggesting a small fistula.   I discussed this at length with patient and her family present.  I do  not feel that there is any specific indication  to fix her incidental  finding of an AV fistula, which I suspect has been present for many  years.  She does not have any left leg swelling or left leg ischemic  symptoms and certainly does not have enough flow to create any cardiac  difficulties.  This appears to be a small low-flow AV fistula.  I  explained that the only indication that I could see to fix this would be  if she was having a femoral cutdown for a percutaneous valve, then  certainly would fix the AV fistula at that time only.  She will continue  her follow-up with Dr. Cornelius Moras, and he will notify us should he wish  operative repair.     Larina Earthly, M.D.  Electronically Signed   TFE/MEDQ  D:  09/25/2009  T:  09/25/2009  Job:  [id number]   cc:   Salvatore Decent. Cornelius Moras, M.D.  Thora Lance, M.D.  Francisca December, M.D.

## 2010-12-14 NOTE — Assessment & Plan Note (Signed)
OFFICE VISIT   Tami Ramirez, Tami Ramirez  DOB:  02/20/1929                                        October 06, 2009  CHART #:  [id number]   The patient returns to the office today for further follow-up and  preoperative counseling with tentative plan to proceed with mitral valve  repair and maze procedure tomorrow.  She was originally seen in  consultation on September 01, 2009 and a full consultation report and  history and physical exam were dictated at that time.  She was last seen  here in the office on September 21, 2009, and since then she was seen in  consultation by Dr. Tawanna Cooler Early at [facility] due to  the presence of a small AV fistula in the left groin.  Dr. Arbie Cookey  performed a duplex scan in his office, and confirmed that there does  appear to be a small fistula but also noted that the fistula must be  very small with very low-flow.  As such it is not contributing to her  medical issues in anyway, and under the circumstances, he felt that  there was probably no reason to pursue this unless an open operation on  her groin was planned anyway.  The patient stopped taking her Coumadin  last Thursday in anticipation of surgery.  She otherwise continues to  feel fine with exception of exertional shortness of breath.  She has no  new complaints.   I have again reviewed the indications, risks, and potential benefits of  surgery with the patient and her daughter here in the office today.  She  understands the very serious nature of this type of surgery as well as  all associated risks.  She understands that there is a small chance that  we would need to extend to a larger thoracotomy or perhaps convert to a  full median sternotomy depending upon intraoperative findings.  She  understands our plan to repair her mitral valve and perform a maze  procedure in an effort to keep her out of atrial fibrillation.  There is  a small chance that mitral valve  replacement may be necessary if  satisfactory valve repair cannot be performed.  If  this were the case  we would replace her valve using a bioprosthetic tissue valve.  She also  understands that this would come with the potential for late structural  valve deterioration and failure depending upon her longevity, but  because of her advanced age, it would be unlikely that a tissue valve  would wear out during her lifetime.  She understands the rationale for  concomitant maze procedure.  She also understands that if necessary to  explore her left groin that we may try to close her AV fistula if it  appears to be straight forward and easy to do.  On the other hand, based  upon Dr. Bosie Helper  recommendation, we will not pursue this if it would  add any morbidity to her operation.  She specifically understands and  accepts all potential associated risks including but not limited to  risks of death, stroke, myocardial infarction, congestive heart failure,  respiratory failure, pneumonia, bleeding requiring blood transfusion,  arrhythmia, heart block or bradycardia requiring permanent pacemaker.  All of their questions have been addressed.   Salvatore Decent. Cornelius Moras, M.D.  Electronically Signed  CHO/MEDQ  D:  10/06/2009  T:  10/06/2009  Job:  [id number]   cc:   Thora Lance, M.D.  Francisca December, M.D.

## 2010-12-14 NOTE — Cardiovascular Report (Signed)
NAMECAMEREN, ODWYER               ACCOUNT NO.:  [id number]   MEDICAL RECORD NO.:  [id number]          PATIENT TYPE:  OIB   LOCATION:  2899                         FACILITY:  [facility]   PHYSICIAN:  Francisca December, M.D.  DATE OF BIRTH:  [birth year]   DATE OF PROCEDURE:  11/05/2007  DATE OF DISCHARGE:                            CARDIAC CATHETERIZATION   PROCEDURES PERFORMED:  1. Left heart catheterization.  2. Left ventriculogram.  3. Abdominal aortogram.  4. Coronary angiography.   INDICATIONS:  Ms. Tami Ramirez is a 77 year old woman who is now 10  years status post an anterior apical wall myocardial infarction treated  with direct PTCA and stent implantation in the LAD.  She has recently  developed worsening exertional dyspnea and some chest tightness.  Myocardial perfusion study has revealed mild reversible apical and  anteroseptal reversible defect, suggesting LAD obstruction.  The LVEF  was 61%.  She has a long history of hypertension.  She is brought to the  catheterization laboratory at this time to identify the extent of  disease and to provide for further therapeutic options.   PROCEDURE NOTE:  The patient brought to cardiac catheterization  laboratory in fasting state.  The right groin was prepped and draped in  the usual sterile fashion.  Local anesthesia was obtained with  infiltration of 1% lidocaine.  A 5-French catheter sheath was inserted  percutaneously into the right femoral artery utilizing an anterior  approach over guiding J-wire.  The Smart needle was required to locate  the femoral artery.  It was more medial than anticipated.  Left heart  catheterization and coronary angiography then proceeded in a standard  fashion using 5-French #4 left and right Judkins catheter and a 110 cm  pigtail catheter.  A 30 degree RAO cine left ventriculogram was  performed utilizing a power injector, 36 mL were injected at 12 mL per  second.  The pigtail catheter was withdrawn  into the abdominal aorta and  a cine abdominal angiogram in the AP projection performed again using  the power injector.  The pigtail catheter was positioned over the second  lumbar vertebra and 30 mL were injected at 15 mL per second.  At the  completion of the procedure the catheter and catheter sheaths were  removed.  Hemostasis was achieved by direct pressure.  The patient was  transported to the recovery area in stable condition with an intact  distal pulse.  It should be noted that all catheter manipulations were  performed using fluoroscopic observation and exchanges performed over a  long guiding J-wire.   HEMODYNAMIC RESULTS:  Systemic arterial pressure was elevated at 167/65  with a mean of 105 mmHg.  There is no systolic gradient across the  aortic valve.  Left ventricular end-diastolic pressure was 20 mmHg pre  ventriculogram.   ANGIOGRAPHY:  The left ventriculogram demonstrated normal chamber size  and normal global systolic function with a visually estimated ejection  fraction of 60%.  There was focal apical dyskinesis and otherwise  concentric hypertrophy is seen with hyperdynamic function.  There is  mild mitral regurgitation seen.  The stented segment of the LAD is wide  and is easily visualized.  The aortic valve is trileaflet and opens  normally during systole.     The abdominal aortogram demonstrated widely patent renal arteries  bilaterally and some atherosclerotic disease in the distal aorta but  nothing of obstructive significance in no significant aneurysmal  formation.  The proximal iliacs were widely patent down to the  midportion of the internal iliac and common femoral.   There is a right-dominant coronary system present.  The main left  coronary artery is normal.   The left anterior descending artery and its branches are widely patent.  There is a 30% proximal stenosis which is acentric and near the ostium.  The stented segment is in the midportion and  involves the origin of a  large first diagonal branch.  Both the stem segment and the diagonal  branch itself are widely patent.  The ongoing anterior descending artery  reaches and well traverses the apex, and there are no obstructions in  the mid to distal or apical portions of the LAD.   The left circumflex coronary artery and its branches are widely patent.  Four marginal branches arise, all of which are moderate to large in  size.  I can identify no obstructions whatsoever in the circumflex or  its marginal branches.   The right coronary and its branches are widely patent.  There are some  luminal irregularities.  The vessel that bifurcates distally into the  posterior descending artery which is moderate-to-large in size and a  small posterolateral segment with this one single posterolateral or left  ventricular branch.   Collateral vessels are not seen.   FINAL IMPRESSION:  1. Systemic hypertension.  2. No evidence of renal artery stenosis.  3. Intact global left ventricular systolic function with regional wall      motion abnormalities noted.  4. Fluoroscopic and angiographic evidence of concentric left      ventricular hypertrophy.  5. Elevated left ventricular end-diastolic pressure to a moderate      degree.   PLAN:  Medical therapy only is indicated.  More potent diuretics may be  of use.  At her next visit to the office will obtain a noninvasive  hemodynamic study to investigate the need for greater degree of  vasodilatation. She is already on a calcium channel blocker and ACE  inhibitor and a fairly high dose of beta blocker.  This may need to be  decreased as well.  Pulmonary function testing is also advised.      Francisca December, M.D.  Electronically Signed     JHE/MEDQ  D:  11/05/2007  T:  11/05/2007  Job:  [id number]   cc:   Tamera C. Lianne Cure  Thora Lance, M.D.

## 2010-12-14 NOTE — Assessment & Plan Note (Signed)
OFFICE VISIT   Tami Ramirez, Tami Ramirez  DOB:  [birth year]                                        September 21, 2009  CHART #:  [id number]   The patient returns to the office today for further followup of mitral  regurgitation and atrial fibrillation.  She was originally seen in  consultation on September 01, 2009, and a full consultation report history  and physical exam was dictated at that time.  Since then, she underwent  transesophageal echocardiogram by Dr. Amil Amen on September 04, 2009.  This  reveals moderate to severe mitral regurgitation with remarkably normal-  appearing left ventricular function.  The functional anatomy of the  mitral valve is notable for what appears to be functional mitral  regurgitation with type I pathology.  Specifically, the anterior and  posterior leaflets of the mitral valve both seem to move relatively  normally, but there is annular dilatation with incomplete coaptation in  the middle.  There is a broad central jet of mitral regurgitation that  fills the left atrium.  There by report, no flow reversal in the  pulmonary veins, but the jet of regurgitation was quite pronounced.  Left ventricular systolic function appeared remarkably normal, and there  was no sign of left ventricular aneurysm or thrombus in the left  ventricle or atrial appendage.  There was only mild tricuspid  regurgitation and trivial aortic regurgitation.  There was a very small  patent foramen ovale.   The patient underwent CT angiogram of the thoracic and abdominal aorta  and iliac vessels at the [facility] earlier today.  This  did not reveal any flow-limiting atherosclerotic obstruction, but  diminished pulses in the groin were associated with what was found to be  a probable AV fistula in the left groin.  There is no surrounding mass  to suggest a false aneurysm.  The vessels on the right side appear  normal and unaffected.   I reviewed the  results of these tests at length with the patient and her  entire family here in the office today.  We spent considerable time  discussing alternative treatment strategies for management of her mitral  regurgitation and atrial fibrillation.  The relative risks and benefits  of surgical intervention for mitral valve repair and maze procedure were  discussed and contrasted with continued medical therapy.  Because of her  age and associated comorbid medical problems, the risks of surgery would  not be insignificant, but overall I suspect that she would probably be  better off in the long run with decreased symptoms of congestive heart  failure and improved longevity.  Nevertheless, it was made quite clear  that she may continue to have some symptomatology related to congestive  heart failure, and the possibility of recurrent atrial fibrillation  cannot be completely eradicated.   We also discussed the unexpected finding of what appears to be an AV  fistula in the left groin.  To the patient's recollection, she does not  know ever having heart catheterization be the left femoral approach,  although it is possible that at least sheath placement or balloon pump  placement might have occurred at the time of her heart attack in 2009.  Prior to making a definitive decision regarding surgical treatment of  her mitral regurgitation, I suspect that it would best  be that we have  the patient evaluated by a vascular surgeon possibly with duplex scan of  the left groin to get a feel for whether or not this AV fistula is  functionally significant.  If the amount of shunting is large, it might  make sense to have her AV fistula closed first to make sure that her  symptoms of congestive heart failure do not improve and the severity of  mitral regurgitation decrease.  On the other hand, if the AV fistula  appears to be relatively small and hemodynamically insignificant, we  could consider closing it at the  time of her mitral valve repair and  cannulate the left femoral artery at the time of surgery.   I have answered all of the patient's questions and that of her family.  We will plan to see her back in 2 weeks.   Salvatore Decent. Cornelius Moras, M.D.  Electronically Signed   CHO/MEDQ  D:  09/21/2009  T:  09/22/2009  Job:  [id number]   cc:   Francisca December, M.D.  Larina Earthly, M.D.  Thora Lance, M.D.

## 2010-12-14 NOTE — Assessment & Plan Note (Signed)
OFFICE VISIT   Tami Ramirez, Tami Ramirez  DOB:  07/11/1929                                        October 26, 2009  CHART #:  [id number]   HISTORY:  The patient returns for further followup status post right  miniature thoracotomy for mitral valve repair and maze procedure on  October 07, 2009.  She was last seen here in the office on October 16, 2009.  At that time, she had a small amount of murky drainage from her left  groin incision.  Swab culture obtained at that time did not reveal any  pathologic organisms and was reported as multiple organisms present,  none predominant.  We started her on oral Keflex at that time.  Since  then, she has continued to do quite well.  She returns to the office  today accompanied by her daughter.  She now has only minimal residual  soreness in her right chest.  She has not had any shortness of breath  and she has been ambulating without oxygen and maintaining oxygen  saturation levels between 90 and 94%.  Her appetite is reasonably good  and she has been supplementing her diet with Boost, which she likes  drinking.  She has not had any fevers or chills.  She has had continued  small amount of intermittent drainage from the left groin, but overall  this seems to be diminishing.  No other abnormalities are noted.   PHYSICAL EXAMINATION:  Notable for a well-appearing elderly female with  blood pressure 130/72, pulse 78 and regular.  Two-channel telemetry  rhythm strip demonstrates normal sinus rhythm.  Oxygen saturation is 90%  on room air.  Auscultation of the chest reveals clear breath sounds  which are symmetrical bilaterally.  No wheezes, rales, or rhonchi noted.  Cardiovascular exam is notable for regular rate and rhythm.  No murmurs,  rubs, or gallops are appreciated.  The right miniature thoracotomy  incision is healing nicely.  The left groin incision appears better than  it did at her last visit.  There is still a slight amount of  drainage  medially with very mild amount of surrounding erythema.  This is much  less red or tender than it appeared the last time she was here in the  office.  There is no fluctuance on exam and on palpation, no additional  drainage can be expressed.   DIAGNOSTIC TESTS:  Chest x-ray performed today at the [facility] is reviewed.  This demonstrates clear lung fields bilaterally.  There is a very small right pleural effusion, which is slightly  increased in comparison with the last x-ray.  No other abnormalities are  noted.   IMPRESSION:  Satisfactory progress following mitral valve repair and  maze procedure.  The patient appears to be maintaining sinus rhythm.  The small amount of drainage from her left groin incision appears to be  drying up and overall she is feeling better.  She is no longer requiring  oxygen.   PLAN:  We will keep the patient on oral Keflex for another 2 weeks.  We  will see her back in the office for 2 weeks for a wound check.  In the  meanwhile, encourage her to continue to gradually increase her physical  activity as tolerated.  We have not made any changes in her  current  medications.   Salvatore Decent. Cornelius Moras, M.D.  Electronically Signed   CHO/MEDQ  D:  10/26/2009  T:  10/27/2009  Job:  [id number]   cc:   Francisca December, M.D.  Thora Lance, M.D.

## 2010-12-14 NOTE — Assessment & Plan Note (Signed)
OFFICE VISIT   Tami, Ramirez  DOB:  Jul 04, 1929                                        Dec 07, 2009  CHART #:  [id number]   HISTORY:  The patient returns for further followup status post right  miniature thoracotomy for mitral valve repair and Cox CryoMaze  procedure.  She was last seen here in the office on November 16, 2009.  At  that time, she was really complaining and noticing significant numbness,  weakness, and pain involving her left hand and forearm.  We obtained MRI  and MRA of the brain and this did not show any sign of a stroke.  The  patient was seen in consultation by Dr. Vickey Huger and she has been  scheduled to undergo some nerve conduction studies to search for  possible source of peripheral nerve injury.  She has not had any sort of  imaging of her cervical spine performed yet, but plans will be made  pending results of her nerve conduction studies.  She otherwise has been  doing quite well.  She was seen in followup by Dr. Amil Amen and started  on amlodipine for hypertension.  She otherwise feels quite well.  She  has not had any shortness of breath.  She has not had any tachy  palpitations.  She has not had much in terms of any pain in her chest  and really the only thing bothering her is her left hand and forearm.   PHYSICAL EXAMINATION:  Notable for well-appearing female with blood  pressure is 158/82 and pulse 78 and regular.  Examination of the chest  reveals clear breath sounds that are symmetrical bilaterally.  Cardiovascular exam is notable for regular rate and rhythm.  The abdomen  is soft and nontender.  The extremities are warm and well perfused.  The  left groin incision has now almost completely healed and healed over.  No other abnormalities are noted.   IMPRESSION:  The patient is doing very well overall with exception of  numbness, weakness, and pain involving her left hand and forearm.  MRI  and MRA of the brain did not  reveal anything to suggest that she has had  a stroke.  She has been seen by Dr. Vickey Huger and is having nerve  conduction studies done later this week.  She otherwise seems to be  doing very well.  She is maintaining sinus rhythm.   PLAN:  I have instructed the patient to cut her dose of amiodarone back  to 100 mg daily.  When her current prescription runs out, I think she  should stop taking amiodarone altogether.  If she remains in sinus  rhythm off amiodarone, we could consider getting a CardioNet test or  Holter monitor at some point later this summer and possibly stop her  Coumadin.  I have encouraged her to continue to increase her physical  activity as tolerated.  All of her questions have been addressed.  We  will see her back in 3 months.   Salvatore Decent. Cornelius Moras, M.D.  Electronically Signed   CHO/MEDQ  D:  12/07/2009  T:  12/08/2009  Job:  [id number]   cc:   Francisca December, M.D.  Melvyn Novas, M.D.  Thora Lance, M.D.

## 2011-07-19 IMAGING — CT CT ANGIO CHEST
1 of 7 series · 12 of 36 positions shown · IV contrast ([ID] OMNI 300)
Comparison: Chest CT 12/10/2007.

CTA CHEST

CLINICAL DATA: EVALUATE AORTIC OCCLUSIVE DISEASE.

CT ANGIOGRAPHY CHEST, ABDOMEN AND PELVIS
TECHNIQUE: Multidetector CT imaging through the chest, abdomen and
pelvis was performed using the standard protocol during bolus
administration of intravenous contrast.  Multiplanar reconstructed
images including MIPs were obtained and reviewed to evaluate the
vascular anatomy.
Contrast: 100 ml Omnipaque 300 IV.

[Series 5: angio · axial · 0.70mm/px · z∈[-532,-67]mm · 12 of 220 slices shown]
[im 17/220  lung]
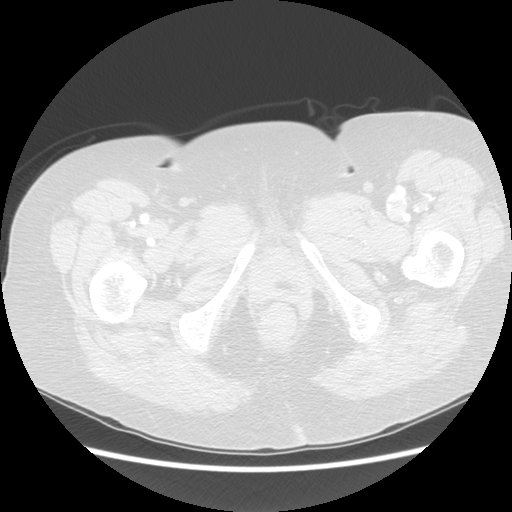
[im 34/220  mediastinal]
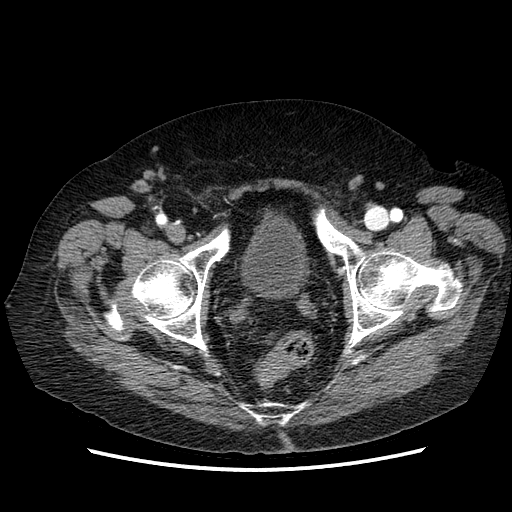
[im 51/220  lung]
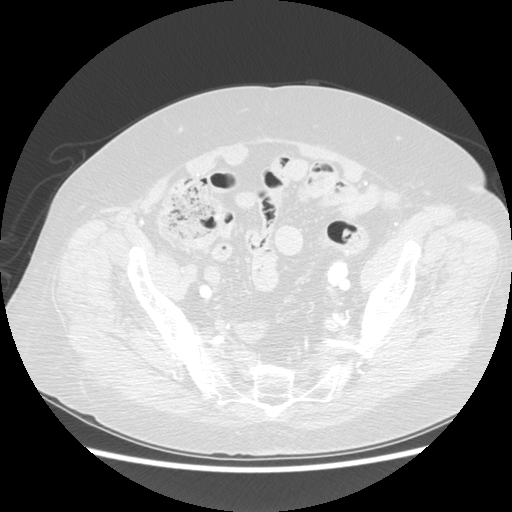
[im 68/220  mediastinal]
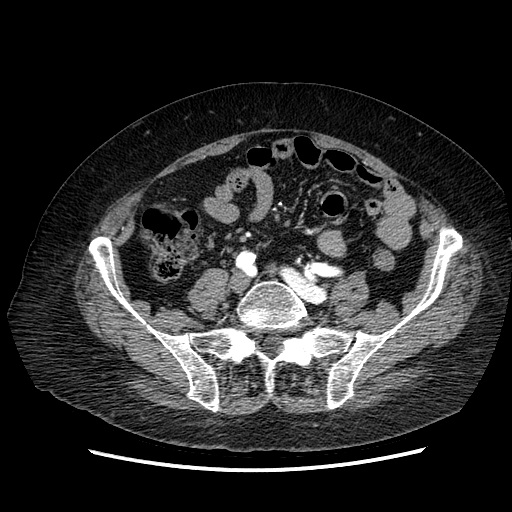
[im 85/220  lung]
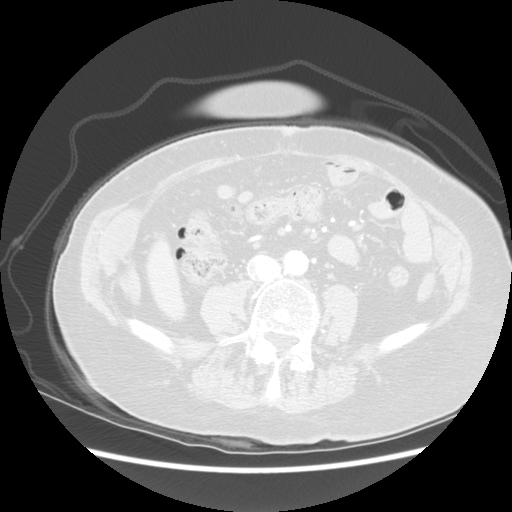
[im 102/220  mediastinal]
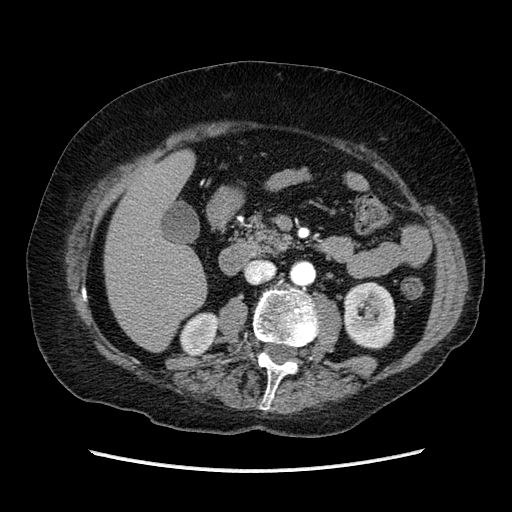
[im 118/220  lung]
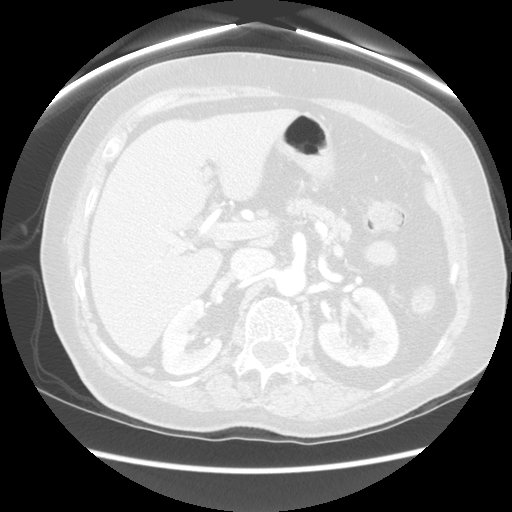
[im 135/220  mediastinal]
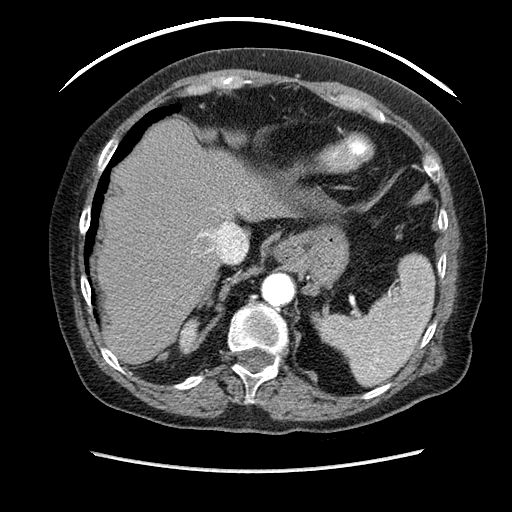
[im 152/220  lung]
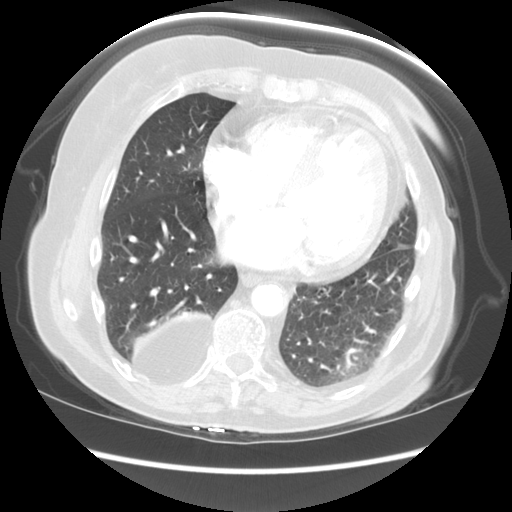
[im 169/220  mediastinal]
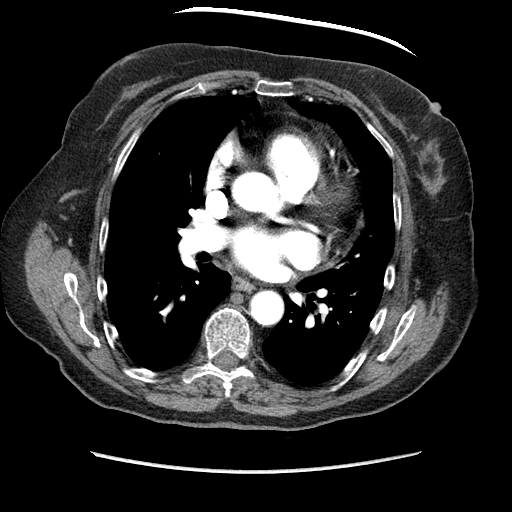
[im 186/220  lung]
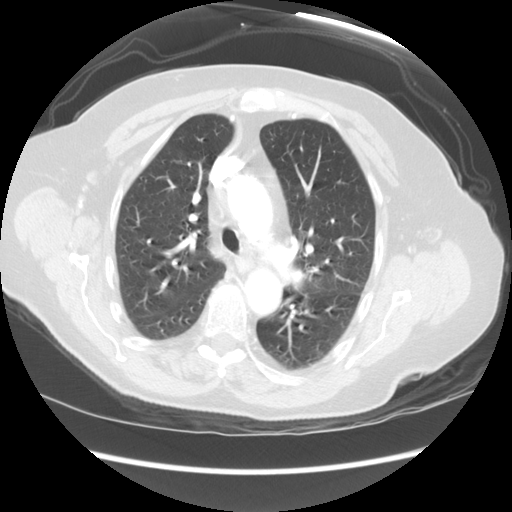
[im 203/220  mediastinal]
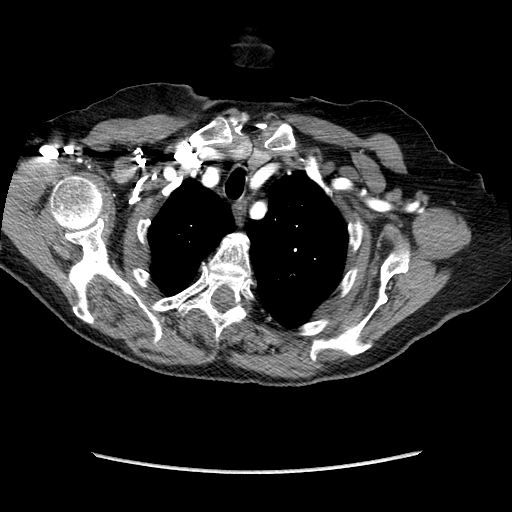

[12 of 36 positions shown; findings below may reference images not displayed]

FINDINGS: Aorta is normal caliber.  Mild to moderate
atherosclerotic disease in the aortic arch and descending thoracic
aorta.  No aneurysm or dissection.  Coronary artery calcifications
are present.  Heart is upper limits normal in size.

No mediastinal, hilar, or axillary adenopathy.  Visualized thyroid
and chest wall soft tissues unremarkable.

Posterior diaphragmatic hernia noted on the right containing fat.
This is stable.  Lungs are clear.  No effusions.  No acute bony
abnormality.

 Review of the MIP images confirms the above findings.
IMPRESSION: Mild atherosclerotic disease in the aorta.  No aneurysm or
dissection.

Coronary artery disease.

CTA ABDOMEN
FINDINGS: Atherosclerotic disease in the aorta without aneurysm or
dissection.  This is most pronounced infrarenally.  There are
single renal arteries bilaterally which are widely patent.  Celiac
artery, superior mesenteric artery, inferior mesenteric artery are
all widely patent.

Low-density area in the central right hepatic vein is felt
represent admixture of unopacified blood.  This has a similar
appearance on prior study.  Liver, spleen, pancreas, adrenals,
gallbladder unremarkable.  Bilateral renal cysts.  These appear
benign.  No hydronephrosis.  Bowel unremarkable.

There is scoliosis in the lumbar spine.  Degenerative changes
present.  No acute bony abnormality.

 Review of the MIP images confirms the above findings.
IMPRESSION: Mild atherosclerotic disease in the aorta without aneurysm,
dissection, or occlusion.

Renal and mesenteric arteries widely patent.

CTA PELVIS
FINDINGS: There is opacification of the left common femoral vein,
external and common iliac veins, with the right side unopacified.
Below the left groin, the venous structures are unopacified.  This
is compatible with an AV fistula in the left groin, possibly from
either the proximal superficial femoral artery or profunda femoris.
Common iliac arteries are mildly calcified without significant
stenosis, aneurysm or dissection.  Hypogastric arteries, external
iliac arteries and common femoral arteries are widely patent.

There is sigmoid diverticulosis.  Pelvic small bowel grossly
unremarkable.  No free fluid, free air, or adenopathy. Bladder
unremarkable.  Prior hysterectomy.  No adnexal masses.

 Review of the MIP images confirms the above findings.
IMPRESSION: Findings compatible with left groin AV fistula, with opacification
of the left common femoral and iliac venous system.  This likely
arises from the proximal left superficial femoral artery or
profunda femoris.

Diverticulosis.

## 2011-09-26 ENCOUNTER — Encounter: Payer: Self-pay | Admitting: Thoracic Surgery (Cardiothoracic Vascular Surgery)

## 2011-09-26 ENCOUNTER — Ambulatory Visit (INDEPENDENT_AMBULATORY_CARE_PROVIDER_SITE_OTHER): Payer: Self-pay | Admitting: Thoracic Surgery (Cardiothoracic Vascular Surgery)

## 2011-09-26 VITALS — BP 143/77 | HR 76 | Resp 20 | Ht <= 58 in | Wt 153.0 lb

## 2011-09-26 DIAGNOSIS — I509 Heart failure, unspecified: Secondary | ICD-10-CM

## 2011-09-26 DIAGNOSIS — Z9889 Other specified postprocedural states: Secondary | ICD-10-CM

## 2011-09-26 DIAGNOSIS — I059 Rheumatic mitral valve disease, unspecified: Secondary | ICD-10-CM

## 2011-09-26 DIAGNOSIS — Z8679 Personal history of other diseases of the circulatory system: Secondary | ICD-10-CM

## 2011-09-26 DIAGNOSIS — I5032 Chronic diastolic (congestive) heart failure: Secondary | ICD-10-CM

## 2011-09-26 NOTE — Progress Notes (Addendum)
[address]            [place] [address]          [phone]     CARDIOTHORACIC SURGERY OFFICE NOTE  Referring Provider is Kary Kos, * PCP is Donato Schultz, MD, MD   HPI:  Patient returns for 2 year follow-up s/p mitral valve repair and maze procedure in March 2011.  She has continued to do well, although recently the patient has had some increase in symptoms of chronic diastolic congestive heart failure. She apparently had an echocardiogram 2 weeks ago. We do not have results of that report. She has not had any irregular heart rhythms. She has stable exertional shortness of breath and chronic bilateral lower extremity edema. She has otherwise been doing fairly well.   Current Outpatient Prescriptions  Medication Sig Dispense Refill  . acetaminophen (TYLENOL) 325 MG tablet Take 650 mg by mouth every 6 (six) hours as needed.      Marland Kitchen amLODipine (NORVASC) 10 MG tablet Take 10 mg by mouth daily.      Marland Kitchen aspirin 81 MG tablet Take 81 mg by mouth daily.      . furosemide (LASIX) 40 MG tablet Take 40 mg by mouth 2 (two) times daily.      Marland Kitchen losartan (COZAAR) 100 MG tablet Take 100 mg by mouth daily.       . Multiple Vitamin (MULTIVITAMIN) capsule Take 1 capsule by mouth daily.      . nebivolol (BYSTOLIC) 10 MG tablet Take 10 mg by mouth daily.      . nitroGLYCERIN (NITROSTAT) 0.4 MG SL tablet Place 0.4 mg under the tongue every 5 (five) minutes as needed.      . potassium chloride SA (K-DUR,KLOR-CON) 20 MEQ tablet Take 20 mEq by mouth daily. 1 and 1/2 tab po every day ( total 30 meq daily)      . pravastatin (PRAVACHOL) 40 MG tablet Take 40 mg by mouth daily.          Physical Exam:   BP 143/77  Pulse 76  Resp 20  Ht 4' 9.5" (1.461 m)  Wt 153 lb (69.4 kg)  BMI 32.54 kg/m2  SpO2 91%  General:  Elderly and somewhat frail appearing  Chest:   Few inspiratory wheezes scattered throughout  CV:   Regular rate and rhythm without  murmur  Abdomen:  Soft and nontender  Extremities:  Warm and well-perfused with moderate bilateral lower extremity edema  Diagnostic Tests:  2 channel telemetry rhythm strip demonstrates sinus rhythm   Impression:  The patient  Is now 2 years out following mitral valve repair and Maze procedure. She is maintaining sinus rhythm. She has had some increased symptoms of congestive heart failure and recently had her dose of diuretic therapy increased.  Plan:  We will obtain results of recent echocardiogram to followup the status of her mitral valve repair. We will leave further adjustments in medical therapy for congestive heart failure to the discretion of Dr. Anne Fu in colleagues. In the future the patient will call and return to see [place] as needed.   Salvatore Decent. Cornelius Moras, MD 09/26/2011 10:54 AM     Results of 2-D echocardiogram performed 09/13/2011 at [facility] cardiology is reviewed. By report there was moderate to severe concentric left ventricular hypertrophy with left ventricular ejection fraction estimated 50-55%. There was previous mitral valve repair  with trace mitral regurgitation and mean gradient across the valve estimated 5 mm mercury. There was trivial tricuspid regurgitation. No other abnormalities were noted.  Bijon Mineer H

## 2013-04-21 ENCOUNTER — Encounter: Payer: Self-pay | Admitting: Cardiology

## 2013-04-21 DIAGNOSIS — I4891 Unspecified atrial fibrillation: Secondary | ICD-10-CM

## 2013-04-21 DIAGNOSIS — I059 Rheumatic mitral valve disease, unspecified: Secondary | ICD-10-CM | POA: Insufficient documentation

## 2013-04-21 DIAGNOSIS — I10 Essential (primary) hypertension: Secondary | ICD-10-CM | POA: Insufficient documentation

## 2013-04-21 DIAGNOSIS — I6990 Unspecified sequelae of unspecified cerebrovascular disease: Secondary | ICD-10-CM

## 2013-04-21 DIAGNOSIS — I2510 Atherosclerotic heart disease of native coronary artery without angina pectoris: Secondary | ICD-10-CM | POA: Insufficient documentation

## 2013-04-21 DIAGNOSIS — I5032 Chronic diastolic (congestive) heart failure: Secondary | ICD-10-CM

## 2013-04-21 DIAGNOSIS — Z954 Presence of other heart-valve replacement: Secondary | ICD-10-CM

## 2013-04-21 DIAGNOSIS — E7800 Pure hypercholesterolemia, unspecified: Secondary | ICD-10-CM | POA: Insufficient documentation

## 2013-05-02 ENCOUNTER — Ambulatory Visit (INDEPENDENT_AMBULATORY_CARE_PROVIDER_SITE_OTHER): Payer: Medicare Other | Admitting: Cardiology

## 2013-05-02 ENCOUNTER — Encounter: Payer: Self-pay | Admitting: Cardiology

## 2013-05-02 VITALS — BP 153/79 | HR 96 | Ht <= 58 in | Wt 148.4 lb

## 2013-05-02 DIAGNOSIS — I4891 Unspecified atrial fibrillation: Secondary | ICD-10-CM

## 2013-05-02 DIAGNOSIS — Z9889 Other specified postprocedural states: Secondary | ICD-10-CM

## 2013-05-02 DIAGNOSIS — I5032 Chronic diastolic (congestive) heart failure: Secondary | ICD-10-CM

## 2013-05-02 DIAGNOSIS — Z8679 Personal history of other diseases of the circulatory system: Secondary | ICD-10-CM

## 2013-05-02 DIAGNOSIS — I10 Essential (primary) hypertension: Secondary | ICD-10-CM

## 2013-05-02 DIAGNOSIS — I059 Rheumatic mitral valve disease, unspecified: Secondary | ICD-10-CM

## 2013-05-02 NOTE — Progress Notes (Signed)
Patient ID: Tami Ramirez, female   DOB: [birth year], 79 y.o.   MRN: [id number]      [address] [place], [place]  [address] Phone: [phone] Fax:  [phone]  Date:  05/02/2013   ID:  Tami Ramirez, Tami Ramirez 1929/07/24, MRN [id number]  PCP:  Donato Schultz, MD   History of Present Illness: Tami Ramirez is a 79 y.o. female coronary artery disease prior stent in LAD in 1997, PTCA of diagonal following anterior wall myocardial infarction, mitral valve repair in March of 2011 do to severe mitral regurgitation. Also has hypertension, hyperlipidemia. She has not been taking Bystolic. Doing very well. She showed me blood pressures from home. She did have some isolated 150/160 systolic. Overall though very good. She has had issues in the past with chronic diastolic heart failure. Previous creatinine 0.85. Feels OK. No CP, no SOB. No bleeding. No CVA.    Wt Readings from Last 3 Encounters:  05/02/13 67.314 kg (148 lb 6.4 oz)  09/26/11 69.4 kg (153 lb)     Past Medical History  Diagnosis Date  . S/P mitral valve repair 10/07/2009  . S/P Maze operation for atrial fibrillation 10/07/2009  . Essential hypertension, benign   . ASCVD (arteriosclerotic cardiovascular disease)     SV,AWMI June 97, PTCA/stent LAD 1997, PTCA diagonal 08/07  . Exertional dyspnea     Chronic  . Overweight   . Chronic diastolic heart failure   . Atrial fibrillation   . Chronic kidney disease   . Osteoporosis   . Equilibrium disorder     Chronic    Past Surgical History  Procedure Laterality Date  . Right miniature thoracotomy for mitral valve repair  10/07/2009    28-mm Sorin Memo 3-D ring annuloplasty  . Cox maze procedure and ligation of left femoral av fistula  10/07/2009    Dr Cornelius Moras  . Total abdominal hysterectomy w/ bilateral salpingoophorectomy    . Appendectomy      Current Outpatient Prescriptions  Medication Sig Dispense Refill  . amLODipine (NORVASC) 10 MG tablet Take 10 mg by  mouth daily.      Marland Kitchen aspirin 81 MG tablet Take 81 mg by mouth daily.      . Calcium Citrate-Vitamin D (CITRACAL + D PO) Take by mouth.      . furosemide (LASIX) 40 MG tablet Take 40 mg by mouth 2 (two) times daily.      Marland Kitchen ibuprofen (ADVIL,MOTRIN) 200 MG tablet Take 400 mg by mouth as needed for pain.      Marland Kitchen losartan (COZAAR) 100 MG tablet Take 100 mg by mouth daily.       . Multiple Vitamin (MULTIVITAMIN) capsule Take 1 capsule by mouth daily.      . nebivolol (BYSTOLIC) 10 MG tablet Take 10 mg by mouth daily.      . nitroGLYCERIN (NITROSTAT) 0.4 MG SL tablet Place 0.4 mg under the tongue every 5 (five) minutes as needed.      . potassium chloride SA (K-DUR,KLOR-CON) 20 MEQ tablet Take 20 mEq by mouth daily. 1 and 1/2 tab po every day ( total 30 meq daily)      . pravastatin (PRAVACHOL) 40 MG tablet Take 40 mg by mouth daily.      Marland Kitchen spironolactone (ALDACTONE) 25 MG tablet Take 12.5 mg by mouth daily.       No current facility-administered medications for this visit.    Allergies:    Allergies  Allergen  Reactions  . Codeine Nausea Only  . Darvocet [Propoxyphene-Acetaminophen] Other (See Comments)    Spaced out  . Lisinopril Cough  . Morphine Sulfate Other (See Comments)    Low heart rate   . Spironolactone Other (See Comments)    hyperkalemia  . Penicillins Rash    Social History:  The patient  reports that she has never smoked. She has never used smokeless tobacco. She reports that she does not drink alcohol or use illicit drugs.   ROS:  Please see the history of present illness.   Denies any bleeding, syncope, orthopnea, PND All other systems reviewed and negative.   PHYSICAL EXAM: VS:  BP 153/79  Pulse 96  Ht 4' 9.5" (1.461 m)  Wt 67.314 kg (148 lb 6.4 oz)  BMI 31.54 kg/m2 Well nourished, well developed, in no acute distress HEENT: normal Neck: no JVD Cardiac:  normal S1, S2; RRR; no murmur Lungs:  clear to auscultation bilaterally, no wheezing, rhonchi or rales Abd:  soft, nontender, no hepatomegaly Ext: no edema Skin: warm and dry Neuro: no focal abnormalities noted  EKG:  Sinus rhythm, 96, occasional PACs, PVCs. Nonspecific T-wave flattening.     ASSESSMENT AND PLAN:  79 year old with paroxysmal atrial fibrillation status post mitral valve repair, maze procedure, CAD, hypertension, hyperlipidemia.  1. Atrial fibrillation-currently in sinus rhythm. Doing very well. No further evidence of atrial fibrillation at this time. She's not currently on anticoagulation. We will continue to monitor. 2. PACs-seen on EKG. Asymptomatic. 3. Status post mitral valve repair. Doing well. Dental antibiotics. 4. Hypertension-under good control. No changes made. I'm okay with her being off of Bystolic. 5. We will see her back in 6 months.  Signed, Donato Schultz, MD [facility]  05/02/2013 11:19 AM

## 2013-05-02 NOTE — Patient Instructions (Signed)
Your physician has recommended you make the following change in your medication:   1. Stop Bystolic  Your physician wants you to follow-up in: 6 months with Dr. Algis Liming will receive a reminder letter in the mail two months in advance. If you don't receive a letter, please call our office to schedule the follow-up appointment.

## 2013-05-17 ENCOUNTER — Other Ambulatory Visit: Payer: Self-pay | Admitting: Cardiology

## 2013-05-17 MED ORDER — PRAVASTATIN SODIUM 40 MG PO TABS: 40.0000 mg | ORAL_TABLET | Freq: Every day | ORAL | Status: DC

## 2013-06-03 ENCOUNTER — Other Ambulatory Visit: Payer: Self-pay | Admitting: Cardiology

## 2013-06-19 ENCOUNTER — Other Ambulatory Visit: Payer: Self-pay | Admitting: Cardiology

## 2013-10-23 ENCOUNTER — Ambulatory Visit
Admission: RE | Admit: 2013-10-23 | Discharge: 2013-10-23 | Disposition: A | Discharge status: Home / Self Care | Payer: Medicare Other | Source: Ambulatory Visit | Attending: Internal Medicine | Admitting: Internal Medicine

## 2013-10-23 ENCOUNTER — Other Ambulatory Visit: Payer: Self-pay | Admitting: Internal Medicine

## 2013-10-23 DIAGNOSIS — R059 Cough, unspecified: Secondary | ICD-10-CM

## 2013-10-23 DIAGNOSIS — R05 Cough: Secondary | ICD-10-CM

## 2013-11-27 ENCOUNTER — Other Ambulatory Visit: Payer: Self-pay | Admitting: Cardiology

## 2013-12-05 ENCOUNTER — Other Ambulatory Visit: Payer: Self-pay

## 2013-12-05 MED ORDER — PRAVASTATIN SODIUM 40 MG PO TABS: 40.0000 mg | ORAL_TABLET | Freq: Every day | ORAL | Status: DC

## 2013-12-26 ENCOUNTER — Other Ambulatory Visit: Payer: Self-pay | Admitting: Cardiology

## 2013-12-27 ENCOUNTER — Ambulatory Visit: Payer: Medicare Other | Admitting: Cardiology

## 2014-01-29 ENCOUNTER — Other Ambulatory Visit: Payer: Self-pay | Admitting: Cardiology

## 2014-02-05 ENCOUNTER — Ambulatory Visit (INDEPENDENT_AMBULATORY_CARE_PROVIDER_SITE_OTHER): Payer: Medicare Other | Admitting: Cardiology

## 2014-02-05 ENCOUNTER — Encounter: Payer: Self-pay | Admitting: Cardiology

## 2014-02-05 VITALS — BP 122/74 | HR 109 | Ht <= 58 in | Wt 147.0 lb

## 2014-02-05 DIAGNOSIS — I2510 Atherosclerotic heart disease of native coronary artery without angina pectoris: Secondary | ICD-10-CM

## 2014-02-05 DIAGNOSIS — I480 Paroxysmal atrial fibrillation: Secondary | ICD-10-CM

## 2014-02-05 DIAGNOSIS — Z9889 Other specified postprocedural states: Secondary | ICD-10-CM

## 2014-02-05 DIAGNOSIS — I4891 Unspecified atrial fibrillation: Secondary | ICD-10-CM

## 2014-02-05 DIAGNOSIS — Z8679 Personal history of other diseases of the circulatory system: Secondary | ICD-10-CM

## 2014-02-05 MED ORDER — AMLODIPINE BESYLATE 10 MG PO TABS: ORAL_TABLET | ORAL | Status: DC

## 2014-02-05 MED ORDER — NITROGLYCERIN 0.4 MG SL SUBL: 0.4000 mg | SUBLINGUAL_TABLET | SUBLINGUAL | Status: AC | PRN

## 2014-02-05 MED ORDER — LOSARTAN POTASSIUM 100 MG PO TABS: ORAL_TABLET | ORAL | Status: DC

## 2014-02-05 MED ORDER — SPIRONOLACTONE 25 MG PO TABS: ORAL_TABLET | ORAL | Status: DC

## 2014-02-05 MED ORDER — PRAVASTATIN SODIUM 40 MG PO TABS: ORAL_TABLET | ORAL | Status: DC

## 2014-02-05 MED ORDER — FUROSEMIDE 40 MG PO TABS: 40.0000 mg | ORAL_TABLET | Freq: Two times a day (BID) | ORAL | Status: DC

## 2014-02-05 NOTE — Progress Notes (Signed)
Patient ID: Tami Ramirez, female   DOB: 1928-08-12, 80 y.o.   MRN: [id number]      [address] [place], [place]  [address] Phone: [phone] Fax:  [phone]  Date:  02/05/2014   ID:  Tami Ramirez, DOB 1928-08-12, MRN [id number]  PCP:  Donato SchultzSKAINS, Deshea Pooley, MD   History of Present Illness: Tami Ramirez is a 80 y.o. female coronary artery disease prior stent in LAD in 1997, PTCA of diagonal following anterior wall myocardial infarction, mitral valve repair in March of 2011 do to severe mitral regurgitation. Also has hypertension, hyperlipidemia. She has not been taking Bystolic. Doing very well. She showed me blood pressures from home. She did have some isolated 150/160 systolic. Overall though very good. She has had issues in the past with chronic diastolic heart failure. Previous creatinine 0.85. Feels OK. No CP, no significant SOB. No bleeding. No CVA.    Wt Readings from Last 3 Encounters:  02/05/14 147 lb (66.679 kg)  05/02/13 148 lb 6.4 oz (67.314 kg)  09/26/11 153 lb (69.4 kg)     Past Medical History  Diagnosis Date  . S/P mitral valve repair 10/07/2009  . S/P Maze operation for atrial fibrillation 10/07/2009  . Essential hypertension, benign   . ASCVD (arteriosclerotic cardiovascular disease)     SV,AWMI June 97, PTCA/stent LAD 1997, PTCA diagonal 08/07  . Exertional dyspnea     Chronic  . Overweight   . Chronic diastolic heart failure   . Atrial fibrillation   . Chronic kidney disease   . Osteoporosis   . Equilibrium disorder     Chronic    Past Surgical History  Procedure Laterality Date  . Right miniature thoracotomy for mitral valve repair  10/07/2009    28-mm Sorin Memo 3-D ring annuloplasty  . Cox maze procedure and ligation of left femoral av fistula  10/07/2009    Dr Cornelius Moraswen  . Total abdominal hysterectomy w/ bilateral salpingoophorectomy    . Appendectomy      Current Outpatient Prescriptions  Medication Sig Dispense Refill  .  acetaminophen (TYLENOL) 325 MG tablet Take 650 mg by mouth every 6 (six) hours as needed.      Marland Kitchen. amLODipine (NORVASC) 10 MG tablet TAKE 1 TABLET EVERY DAY  90 tablet  0  . aspirin 81 MG tablet Take 81 mg by mouth daily.      . furosemide (LASIX) 40 MG tablet Take 40 mg by mouth 2 (two) times daily.      Marland Kitchen. ibuprofen (ADVIL,MOTRIN) 200 MG tablet Take 400 mg by mouth as needed for pain.      Marland Kitchen. losartan (COZAAR) 100 MG tablet TAKE 1 TABLET EVERY DAY  30 tablet  1  . Multiple Vitamin (MULTIVITAMIN) capsule Take 1 capsule by mouth daily.      . nitroGLYCERIN (NITROSTAT) 0.4 MG SL tablet Place 0.4 mg under the tongue every 5 (five) minutes as needed.      . pravastatin (PRAVACHOL) 40 MG tablet TAKE ONE TABLET BY MOUTH EVERY DAY  30 tablet  0  . spironolactone (ALDACTONE) 25 MG tablet TAKE ONE-HALF (1/2) TABLET (12.5MG ) ONCEDAILY  15 tablet  0   No current facility-administered medications for this visit.    Allergies:    Allergies  Allergen Reactions  . Codeine Nausea Only  . Darvocet [Propoxyphene N-Acetaminophen] Other (See Comments)    Spaced out  . Lisinopril Cough  . Morphine Sulfate Other (See Comments)  Low heart rate   . Spironolactone Other (See Comments)    hyperkalemia  . Penicillins Rash    Social History:  The patient  reports that she has never smoked. She has never used smokeless tobacco. She reports that she does not drink alcohol or use illicit drugs.   ROS:  Please see the history of present illness.   Denies any bleeding, syncope, orthopnea, PND All other systems reviewed and negative.   PHYSICAL EXAM: VS:  BP 122/74  Pulse 88  Ht 4' 9.5" (1.461 m)  Wt 147 lb (66.679 kg)  BMI 31.24 kg/m2 Well nourished, well developed, in no acute distress HEENT: normal Neck: no JVD Cardiac:  normal S1, S2; mildly tachycardic, ectopy, slightly irregular; no murmur Lungs:  clear to auscultation bilaterally, no wheezing, rhonchi or rales Abd: soft, nontender, no  hepatomegaly Ext: no edema Skin: warm and dry Neuro: no focal abnormalities noted  EKG:   02/05/14-sinus tachycardia rate 109 with occasional PACs, nonspecific ST-T wave changes, inversion in lateral precordial leads, prior EKG showed Sinus rhythm, 96, occasional PACs, PVCs. Nonspecific T-wave flattening.     ASSESSMENT AND PLAN:  80 year old with paroxysmal atrial fibrillation status post mitral valve repair, maze procedure, CAD, hypertension, hyperlipidemia.  1. Atrial fibrillation-paroxysmal, currently in sinus rhythm/tachycardia. Doing very well. No further evidence of atrial fibrillation at this time. She's not currently on anticoagulation. We will continue to monitor. 2. PACs-seen on EKG. Asymptomatic. 3. Status post mitral valve repair. Doing well. Dental antibiotics. 4. Hypertension-under good control. No changes made. I'm okay with her being off of Bystolic although this would help with her tachycardia. I have asked her to check her blood pressures at home occasionally and to let me know if her resting heart rate is consistently greater than 100. 5. We will see her back in 6 months.  Signed, Donato SchultzMark Gabreal Worton, MD [facility]  02/05/2014 2:02 PM

## 2014-02-05 NOTE — Patient Instructions (Signed)
The current medical regimen is effective;  continue present plan and medications.  Please check your blood pressure once a week.  Follow up in 6 months with Dr Anne FuSkains.  You will receive a letter in the mail 2 months before you are due.  Please call us when you receive this letter to schedule your follow up appointment.

## 2014-10-06 ENCOUNTER — Other Ambulatory Visit: Payer: Self-pay | Admitting: Internal Medicine

## 2014-10-06 ENCOUNTER — Ambulatory Visit
Admission: RE | Admit: 2014-10-06 | Discharge: 2014-10-06 | Disposition: A | Discharge status: Home / Self Care | Payer: Medicare Other | Source: Ambulatory Visit | Attending: Internal Medicine | Admitting: Internal Medicine

## 2014-10-06 DIAGNOSIS — R0789 Other chest pain: Secondary | ICD-10-CM

## 2014-10-06 DIAGNOSIS — M898X1 Other specified disorders of bone, shoulder: Secondary | ICD-10-CM

## 2014-11-17 ENCOUNTER — Inpatient Hospital Stay (HOSPITAL_COMMUNITY)
Admission: EM | Admit: 2014-11-17 | Discharge: 2014-11-19 | DRG: 069 | Disposition: A | Discharge status: Home Health | Payer: Medicare Other | Attending: Internal Medicine | Admitting: Internal Medicine

## 2014-11-17 ENCOUNTER — Encounter (HOSPITAL_COMMUNITY): Payer: Self-pay

## 2014-11-17 ENCOUNTER — Other Ambulatory Visit (HOSPITAL_COMMUNITY): Payer: Self-pay

## 2014-11-17 DIAGNOSIS — I4891 Unspecified atrial fibrillation: Secondary | ICD-10-CM | POA: Diagnosis present

## 2014-11-17 DIAGNOSIS — G459 Transient cerebral ischemic attack, unspecified: Principal | ICD-10-CM | POA: Diagnosis present

## 2014-11-17 DIAGNOSIS — Z7982 Long term (current) use of aspirin: Secondary | ICD-10-CM

## 2014-11-17 DIAGNOSIS — Z955 Presence of coronary angioplasty implant and graft: Secondary | ICD-10-CM

## 2014-11-17 DIAGNOSIS — Z8679 Personal history of other diseases of the circulatory system: Secondary | ICD-10-CM

## 2014-11-17 DIAGNOSIS — R4781 Slurred speech: Secondary | ICD-10-CM | POA: Diagnosis present

## 2014-11-17 DIAGNOSIS — I10 Essential (primary) hypertension: Secondary | ICD-10-CM

## 2014-11-17 DIAGNOSIS — E785 Hyperlipidemia, unspecified: Secondary | ICD-10-CM | POA: Diagnosis present

## 2014-11-17 DIAGNOSIS — E7800 Pure hypercholesterolemia, unspecified: Secondary | ICD-10-CM | POA: Diagnosis present

## 2014-11-17 DIAGNOSIS — N189 Chronic kidney disease, unspecified: Secondary | ICD-10-CM | POA: Diagnosis present

## 2014-11-17 DIAGNOSIS — I129 Hypertensive chronic kidney disease with stage 1 through stage 4 chronic kidney disease, or unspecified chronic kidney disease: Secondary | ICD-10-CM | POA: Diagnosis present

## 2014-11-17 DIAGNOSIS — I5032 Chronic diastolic (congestive) heart failure: Secondary | ICD-10-CM | POA: Diagnosis present

## 2014-11-17 DIAGNOSIS — I480 Paroxysmal atrial fibrillation: Secondary | ICD-10-CM | POA: Diagnosis present

## 2014-11-17 DIAGNOSIS — Z9889 Other specified postprocedural states: Secondary | ICD-10-CM

## 2014-11-17 DIAGNOSIS — M810 Age-related osteoporosis without current pathological fracture: Secondary | ICD-10-CM | POA: Diagnosis present

## 2014-11-17 DIAGNOSIS — I059 Rheumatic mitral valve disease, unspecified: Secondary | ICD-10-CM | POA: Diagnosis present

## 2014-11-17 DIAGNOSIS — I2510 Atherosclerotic heart disease of native coronary artery without angina pectoris: Secondary | ICD-10-CM | POA: Diagnosis present

## 2014-11-17 HISTORY — DX: Cerebral infarction, unspecified: I63.9

## 2014-11-17 LAB — DIFFERENTIAL
Basophils Absolute: 0 10*3/uL (ref 0.0–0.1)
Basophils Relative: 0 % (ref 0–1)
EOS ABS: 0.2 10*3/uL (ref 0.0–0.7)
EOS PCT: 2 % (ref 0–5)
LYMPHS ABS: 2.1 10*3/uL (ref 0.7–4.0)
LYMPHS PCT: 18 % (ref 12–46)
MONO ABS: 1 10*3/uL (ref 0.1–1.0)
Monocytes Relative: 9 % (ref 3–12)
Neutro Abs: 7.9 10*3/uL — ABNORMAL HIGH (ref 1.7–7.7)
Neutrophils Relative %: 71 % (ref 43–77)

## 2014-11-17 LAB — CBC
HEMATOCRIT: 41.8 % (ref 36.0–46.0)
HEMOGLOBIN: 13.6 g/dL (ref 12.0–15.0)
MCH: 29.3 pg (ref 26.0–34.0)
MCHC: 32.5 g/dL (ref 30.0–36.0)
MCV: 90.1 fL (ref 78.0–100.0)
Platelets: 196 10*3/uL (ref 150–400)
RBC: 4.64 MIL/uL (ref 3.87–5.11)
RDW: 13.2 % (ref 11.5–15.5)
WBC: 11.2 10*3/uL — ABNORMAL HIGH (ref 4.0–10.5)

## 2014-11-17 LAB — I-STAT TROPONIN, ED: Troponin i, poc: 0.01 ng/mL (ref 0.00–0.08)

## 2014-11-17 LAB — CBG MONITORING, ED: GLUCOSE-CAPILLARY: 104 mg/dL — AB (ref 70–99)

## 2014-11-17 NOTE — ED Notes (Signed)
Per EMS, Patient lives alone and called family. Patient left a message on the phone for family and family noted slurred speech and confusion. Family reports aphasia and aphasia. When EMS arrived, patient had some slurred speech and was confused. During assessment, patient became alert and oriented and was able to speak normally. Stroke scale was negative. Vitals per EMS: 130/80, 76 HR, 94 CBG, 97 % RA, 18 LAC.

## 2014-11-17 NOTE — ED Notes (Signed)
Pt aware of urine sample needed. Pt unable to go at this time. 

## 2014-11-17 NOTE — ED Notes (Signed)
CBG 104 
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
## 2014-11-18 ENCOUNTER — Emergency Department (HOSPITAL_COMMUNITY): Payer: Medicare Other

## 2014-11-18 ENCOUNTER — Encounter (HOSPITAL_COMMUNITY): Payer: Self-pay | Admitting: Internal Medicine

## 2014-11-18 ENCOUNTER — Other Ambulatory Visit (HOSPITAL_COMMUNITY): Payer: Medicare Other

## 2014-11-18 DIAGNOSIS — Z955 Presence of coronary angioplasty implant and graft: Secondary | ICD-10-CM | POA: Diagnosis not present

## 2014-11-18 DIAGNOSIS — M810 Age-related osteoporosis without current pathological fracture: Secondary | ICD-10-CM | POA: Diagnosis present

## 2014-11-18 DIAGNOSIS — I5032 Chronic diastolic (congestive) heart failure: Secondary | ICD-10-CM | POA: Diagnosis present

## 2014-11-18 DIAGNOSIS — E785 Hyperlipidemia, unspecified: Secondary | ICD-10-CM | POA: Diagnosis present

## 2014-11-18 DIAGNOSIS — I2510 Atherosclerotic heart disease of native coronary artery without angina pectoris: Secondary | ICD-10-CM | POA: Diagnosis present

## 2014-11-18 DIAGNOSIS — I129 Hypertensive chronic kidney disease with stage 1 through stage 4 chronic kidney disease, or unspecified chronic kidney disease: Secondary | ICD-10-CM | POA: Diagnosis present

## 2014-11-18 DIAGNOSIS — N189 Chronic kidney disease, unspecified: Secondary | ICD-10-CM | POA: Diagnosis present

## 2014-11-18 DIAGNOSIS — G459 Transient cerebral ischemic attack, unspecified: Secondary | ICD-10-CM

## 2014-11-18 DIAGNOSIS — I480 Paroxysmal atrial fibrillation: Secondary | ICD-10-CM

## 2014-11-18 DIAGNOSIS — R4781 Slurred speech: Secondary | ICD-10-CM | POA: Diagnosis present

## 2014-11-18 DIAGNOSIS — E780 Pure hypercholesterolemia: Secondary | ICD-10-CM | POA: Diagnosis not present

## 2014-11-18 DIAGNOSIS — Z9889 Other specified postprocedural states: Secondary | ICD-10-CM | POA: Diagnosis not present

## 2014-11-18 DIAGNOSIS — Z7982 Long term (current) use of aspirin: Secondary | ICD-10-CM | POA: Diagnosis not present

## 2014-11-18 LAB — URINALYSIS, ROUTINE W REFLEX MICROSCOPIC
BILIRUBIN URINE: NEGATIVE
Glucose, UA: NEGATIVE mg/dL
HGB URINE DIPSTICK: NEGATIVE
Ketones, ur: NEGATIVE mg/dL
Nitrite: NEGATIVE
Protein, ur: NEGATIVE mg/dL
Specific Gravity, Urine: 1.006 (ref 1.005–1.030)
Urobilinogen, UA: 0.2 mg/dL (ref 0.0–1.0)
pH: 7 (ref 5.0–8.0)

## 2014-11-18 LAB — RAPID URINE DRUG SCREEN, HOSP PERFORMED
AMPHETAMINES: NOT DETECTED
BENZODIAZEPINES: NOT DETECTED
Barbiturates: NOT DETECTED
COCAINE: NOT DETECTED
Opiates: NOT DETECTED
Tetrahydrocannabinol: NOT DETECTED

## 2014-11-18 LAB — COMPREHENSIVE METABOLIC PANEL
ALK PHOS: 85 U/L (ref 39–117)
ALT: 14 U/L (ref 0–35)
ANION GAP: 12 (ref 5–15)
AST: 25 U/L (ref 0–37)
Albumin: 3.8 g/dL (ref 3.5–5.2)
BUN: 16 mg/dL (ref 6–23)
CHLORIDE: 105 mmol/L (ref 96–112)
CO2: 22 mmol/L (ref 19–32)
CREATININE: 0.81 mg/dL (ref 0.50–1.10)
Calcium: 9.3 mg/dL (ref 8.4–10.5)
GFR calc non Af Amer: 65 mL/min — ABNORMAL LOW (ref >90)
GFR, EST AFRICAN AMERICAN: 75 mL/min — AB (ref >90)
Glucose, Bld: 95 mg/dL (ref 70–99)
Potassium: 3.7 mmol/L (ref 3.5–5.1)
Sodium: 139 mmol/L (ref 135–145)
Total Bilirubin: 0.6 mg/dL (ref 0.3–1.2)
Total Protein: 6.7 g/dL (ref 6.0–8.3)

## 2014-11-18 LAB — LIPID PANEL
Cholesterol: 146 mg/dL (ref 0–200)
HDL: 43 mg/dL (ref >39)
LDL CALC: 89 mg/dL (ref 0–99)
Total CHOL/HDL Ratio: 3.4 RATIO
Triglycerides: 70 mg/dL (ref <150)
VLDL: 14 mg/dL (ref 0–40)

## 2014-11-18 LAB — URINE MICROSCOPIC-ADD ON

## 2014-11-18 LAB — BRAIN NATRIURETIC PEPTIDE: B Natriuretic Peptide: 432.5 pg/mL — ABNORMAL HIGH (ref 0.0–100.0)

## 2014-11-18 LAB — PROTIME-INR
INR: 1.21 (ref 0.00–1.49)
Prothrombin Time: 15.4 seconds — ABNORMAL HIGH (ref 11.6–15.2)

## 2014-11-18 MED ORDER — LOSARTAN POTASSIUM 50 MG PO TABS
100.0000 mg | ORAL_TABLET | Freq: Every day | ORAL | Status: DC
  Administered 2014-11-18 – 2014-11-19 (×2): 100 mg via ORAL
  Filled 2014-11-18 (×2): qty 2

## 2014-11-18 MED ORDER — IBUPROFEN 200 MG PO TABS: 400.0000 mg | ORAL_TABLET | ORAL | Status: DC | PRN

## 2014-11-18 MED ORDER — PRAVASTATIN SODIUM 40 MG PO TABS
40.0000 mg | ORAL_TABLET | Freq: Every day | ORAL | Status: DC
  Administered 2014-11-18 – 2014-11-19 (×2): 40 mg via ORAL
  Filled 2014-11-18 (×2): qty 1

## 2014-11-18 MED ORDER — ASPIRIN 325 MG PO TABS: 325.0000 mg | ORAL_TABLET | Freq: Every day | ORAL | Status: DC

## 2014-11-18 MED ORDER — ASPIRIN 300 MG RE SUPP: 300.0000 mg | Freq: Every day | RECTAL | Status: DC

## 2014-11-18 MED ORDER — APIXABAN 5 MG PO TABS
5.0000 mg | ORAL_TABLET | Freq: Two times a day (BID) | ORAL | Status: DC
  Administered 2014-11-18 – 2014-11-19 (×2): 5 mg via ORAL
  Filled 2014-11-18 (×2): qty 1

## 2014-11-18 MED ORDER — NITROGLYCERIN 0.4 MG SL SUBL: 0.4000 mg | SUBLINGUAL_TABLET | SUBLINGUAL | Status: DC | PRN

## 2014-11-18 MED ORDER — AMLODIPINE BESYLATE 10 MG PO TABS
10.0000 mg | ORAL_TABLET | Freq: Every day | ORAL | Status: DC
  Administered 2014-11-18 – 2014-11-19 (×2): 10 mg via ORAL
  Filled 2014-11-18 (×2): qty 1

## 2014-11-18 MED ORDER — ACETAMINOPHEN 325 MG PO TABS
650.0000 mg | ORAL_TABLET | Freq: Four times a day (QID) | ORAL | Status: DC | PRN
  Administered 2014-11-19: 650 mg via ORAL
  Filled 2014-11-18: qty 2

## 2014-11-18 MED ORDER — PERFLUTREN LIPID MICROSPHERE
1.0000 mL | INTRAVENOUS | Status: AC | PRN
  Administered 2014-11-18: 2 mL via INTRAVENOUS
  Filled 2014-11-18: qty 10

## 2014-11-18 MED ORDER — SENNOSIDES-DOCUSATE SODIUM 8.6-50 MG PO TABS
1.0000 | ORAL_TABLET | Freq: Every evening | ORAL | Status: DC | PRN
Start: 2014-11-18 — End: 2014-11-19

## 2014-11-18 MED ORDER — CALCIUM CARBONATE-VITAMIN D 500-200 MG-UNIT PO TABS
1.0000 | ORAL_TABLET | Freq: Every day | ORAL | Status: DC
  Administered 2014-11-18 – 2014-11-19 (×2): 1 via ORAL
  Filled 2014-11-18 (×3): qty 1

## 2014-11-18 MED ORDER — STROKE: EARLY STAGES OF RECOVERY BOOK: Freq: Once | Status: DC

## 2014-11-18 MED ORDER — ADULT MULTIVITAMIN W/MINERALS CH
1.0000 | ORAL_TABLET | Freq: Every day | ORAL | Status: DC
  Administered 2014-11-18 – 2014-11-19 (×2): 1 via ORAL
  Filled 2014-11-18 (×3): qty 1

## 2014-11-18 MED ORDER — HEPARIN SODIUM (PORCINE) 5000 UNIT/ML IJ SOLN
5000.0000 [IU] | Freq: Three times a day (TID) | INTRAMUSCULAR | Status: DC
  Administered 2014-11-18: 5000 [IU] via SUBCUTANEOUS
  Filled 2014-11-18: qty 1

## 2014-11-18 NOTE — Progress Notes (Signed)
Patient arrived around 0530 A&O X 4 NIH -0, walked to bed and is currently resting comfortably, diastolic BP is high at 107 will repeat BP in 15 minutes. Patient on telemetry and heparin is her VTE. Will continue to monitor.

## 2014-11-18 NOTE — Progress Notes (Signed)
BENEFIT CHECK FOR ELIQUIS  ---11/18/2014 1313 by Mardene SayerRA GREENLEE- CMA S/W Lelon MastSAMANTHA [employer]  # [phone]  ELIQUIS 5 MG QD COVER- YES CO-PAY- $ 40.00  30 DAY SUPPLY TIER- 3 DRUG PRIOR APPROVAL - NO PHARMACY : [facility]

## 2014-11-18 NOTE — Discharge Instructions (Signed)

## 2014-11-18 NOTE — ED Notes (Signed)
Family at bedside. 

## 2014-11-18 NOTE — Progress Notes (Signed)
ANTICOAGULATION CONSULT NOTE - Initial Consult  Pharmacy Consult for Apixaban Indication: afib/CVA  Allergies  Allergen Reactions  . Codeine Nausea Only  . Darvocet [Propoxyphene N-Acetaminophen] Other (See Comments)    Spaced out  . Lisinopril Cough  . Morphine Sulfate Other (See Comments)    Low heart rate   . Spironolactone Other (See Comments)    hyperkalemia  . Penicillins Rash    Patient Measurements: Weight: 136 lb 7.4 oz (61.9 kg)  Vital Signs: Temp: 97.5 F (36.4 C) (04/19 0822) Temp Source: Oral (04/19 0822) BP: 163/70 mmHg (04/19 0822) Pulse Rate: 93 (04/19 0822)  Labs:  Recent Labs  11/17/14 2318  HGB 13.6  HCT 41.8  PLT 196  LABPROT 15.4*  INR 1.21  CREATININE 0.81    CrCl cannot be calculated (Unknown ideal weight.).   Medical History: Past Medical History  Diagnosis Date  . S/P mitral valve repair 10/07/2009  . S/P Maze operation for atrial fibrillation 10/07/2009  . Essential hypertension, benign   . ASCVD (arteriosclerotic cardiovascular disease)     SV,AWMI June 97, PTCA/stent LAD 1997, PTCA diagonal 08/07  . Exertional dyspnea     Chronic  . Overweight   . Chronic diastolic heart failure   . Atrial fibrillation   . Chronic kidney disease   . Osteoporosis   . Equilibrium disorder     Chronic  . Stroke     Medications:  Prescriptions prior to admission  Medication Sig Dispense Refill Last Dose  . acetaminophen (TYLENOL) 325 MG tablet Take 650 mg by mouth every 6 (six) hours as needed for mild pain.    Past Week at Unknown time  . amLODipine (NORVASC) 10 MG tablet TAKE 1 TABLET EVERY DAY 30 tablet 11 11/17/2014 at Unknown time  . aspirin 81 MG tablet Take 81 mg by mouth daily.   11/17/2014 at Unknown time  . Calcium-Phosphorus-Vitamin D (CITRACAL +D3 PO) Take 1 tablet by mouth daily.   11/17/2014 at Unknown time  . furosemide (LASIX) 40 MG tablet Take 1 tablet (40 mg total) by mouth 2 (two) times daily. (Patient taking differently: Take  20 mg by mouth daily. ) 60 tablet 11 11/17/2014 at Unknown time  . ibuprofen (ADVIL,MOTRIN) 200 MG tablet Take 400 mg by mouth every 4 (four) hours as needed for moderate pain.    Past Week at Unknown time  . losartan (COZAAR) 100 MG tablet TAKE 1 TABLET EVERY DAY 30 tablet 11 11/17/2014 at Unknown time  . Multiple Vitamin (MULTIVITAMIN) capsule Take 1 capsule by mouth daily.   11/17/2014 at Unknown time  . nitroGLYCERIN (NITROSTAT) 0.4 MG SL tablet Place 1 tablet (0.4 mg total) under the tongue every 5 (five) minutes as needed. 25 tablet prn couple years  . pravastatin (PRAVACHOL) 40 MG tablet TAKE ONE TABLET BY MOUTH EVERY DAY 30 tablet 11 11/17/2014 at Unknown time  . spironolactone (ALDACTONE) 25 MG tablet TAKE ONE-HALF (1/2) TABLET (12.5MG ) ONCEDAILY (Patient not taking: Reported on 11/18/2014) 15 tablet 11     Assessment: 81 y.o. female presents with slurred speech. Noted pt with h/o PAF but not on AC PTA. CHA2DS2-VASc Score is 6. To begin Eliquis for afib/secondary stroke prevention. Wt 61.9 kg, Age 41 yrs, SCr 0.81 so qualifies for  po BID dosing. CBC stable.  Goal of Therapy:  Prevention of CVA Monitor platelets by anticoagulation protocol: Yes   Plan:  Apixaban  po BID Will f/u renal function, s/s bleeding **Note that pt will need reduction in  dose if wt should fall below 60 kg**  Christoper Fabianaron Akashdeep Chuba, PharmD, BCPS Clinical pharmacist, pager [phone] 11/18/2014,9:29 AM

## 2014-11-18 NOTE — Progress Notes (Signed)
  Echocardiogram 2D Echocardiogram with Definity has been performed.  Tami BeamsGREGORY, Tami Ramirez 11/18/2014, 12:51 PM

## 2014-11-18 NOTE — Progress Notes (Addendum)
PATIENT DETAILS Name: Tami Ramirez Age: 81 y.o. Sex: female Date of Birth: [birth year] Admit Date: 11/17/2014 Admitting Physician Lorretta Harp, MD EAV:[id number],JYNW Jomarie Longs, MD  Subjective: Admitted with slurred speech-this has resolved. No other complaints this morning.  Assessment/Plan: Principal Problem:   Suspected TIA (transient ischemic attack): Admitted with slurred speech, which has now completely resolved. No other focal deficits. Given prior history of paroxysmal atrial fibrillation, suspect cardioembolic etiology. Not on anticoagulation in the past. CT head negative for acute abnormalities. Await further workup-MRI/echo/carotids/A1c/lipid panel all pending. Spoke with patient's primary cardiologist Dr. Sabino Niemann reviewed her chart, and recommended to stop aspirin and start her on Eliquis. Subsequently discussed with patient's daughter Ms. Scot Jun over the phone, who was agreeable with this plan.  Active Problems:   History of paroxysmal atrial fibrillation: Remains in sinus rhythm. Has history of maze procedure.CHADS2VASC score of 6, CO2 above regarding anticoagulation. I have discussed the risks/benefits with both patient and daughter, both are understanding.    Hypertension: Cautiously continue with both amlodipine and losartan. Follow.    Dyslipidemia: Continue with pravastatin.    S/P mitral valve repair: History of mitral regurgitation, underwent annuloplasty on March 2011. Await repeat 2-D echocardiogram.     Chronic diastolic heart failure: Clinically compensated. Follow.     History of CAD: Discussed with cardiologist-Dr. Rosanne Sack to stop aspirin and started on Eliquis.  Disposition: Remain inpatient  Antimicrobial agents  See below  Anti-infectives    None      DVT Prophylaxis: Eliquis  Code Status: Full code   Family Communication Daughter-Lisa Denton Lank over the phne  Procedures: None  CONSULTS:   None   MEDICATIONS: Scheduled Meds: .  stroke: mapping our early stages of recovery book   Does not apply Once  . amLODipine  10 mg Oral Daily  . apixaban  5 mg Oral BID  . calcium-vitamin D  1 tablet Oral Daily  . losartan  100 mg Oral Daily  . multivitamin with minerals  1 tablet Oral Daily  . pravastatin  40 mg Oral Daily   Continuous Infusions:  PRN Meds:.acetaminophen, nitroGLYCERIN, senna-docusate    PHYSICAL EXAM: Vital signs in last 24 hours: Filed Vitals:   11/18/14 0531 11/18/14 0602 11/18/14 0822 11/18/14 0956  BP: 137/107 165/75 163/70 155/72  Pulse: 86  93 85  Temp: 98.5 F (36.9 C)  97.5 F (36.4 C) 98 F (36.7 C)  TempSrc: Oral  Oral Oral  Resp: Weight: 61.9 kg (136 lb 7.4 oz)     SpO2: 98%  97% 96%    Weight change:  Filed Weights   11/18/14 0531  Weight: 61.9 kg (136 lb 7.4 oz)   Body mass index is 29 kg/(m^2).   Gen Exam: Awake and alert with clear speech.  Neck: Supple, No JVD.   Chest: B/L Clear.   CVS: S1 S2 Regular, no murmurs.  Abdomen: soft, BS +, non tender, non distended.  Extremities: no edema, lower extremities warm to touch. Neurologic: Non Focal.   Skin: No Rash.   Wounds: N/A.   Intake/Output from previous day:  Intake/Output Summary (Last 24 hours) at 11/18/14 1128 Last data filed at 11/18/14 0900  Gross per 24 hour  Intake    360 ml  Output      0 ml  Net    360 ml     LAB RESULTS: CBC  Recent Labs  Lab 11/17/14 2318  WBC 11.2*  HGB 13.6  HCT 41.8  PLT 196  MCV 90.1  MCH 29.3  MCHC 32.5  RDW 13.2  LYMPHSABS 2.1  MONOABS 1.0  EOSABS 0.2  BASOSABS 0.0    Chemistries   Recent Labs Lab 11/17/14 2318  NA 139  K 3.7  CL 105  CO2 22  GLUCOSE 95  BUN 16  CREATININE 0.81  CALCIUM 9.3    CBG:  Recent Labs Lab 11/17/14 2315  GLUCAP 104*    GFR CrCl cannot be calculated (Unknown ideal weight.).  Coagulation profile  Recent Labs Lab 11/17/14 2318  INR 1.21    Cardiac  Enzymes No results for input(s): CKMB, TROPONINI, MYOGLOBIN in the last 168 hours.  Invalid input(s): CK  Invalid input(s): POCBNP No results for input(s): DDIMER in the last 72 hours. No results for input(s): HGBA1C in the last 72 hours.  Recent Labs  11/18/14 0600  CHOL 146  HDL 43  LDLCALC 89  TRIG 70  CHOLHDL 3.4   No results for input(s): TSH, T4TOTAL, T3FREE, THYROIDAB in the last 72 hours.  Invalid input(s): FREET3 No results for input(s): VITAMINB12, FOLATE, FERRITIN, TIBC, IRON, RETICCTPCT in the last 72 hours. No results for input(s): LIPASE, AMYLASE in the last 72 hours.  Urine Studies No results for input(s): UHGB, CRYS in the last 72 hours.  Invalid input(s): UACOL, UAPR, USPG, UPH, UTP, UGL, UKET, UBIL, UNIT, UROB, ULEU, UEPI, UWBC, URBC, UBAC, CAST, UCOM, BILUA  MICROBIOLOGY: No results found for this or any previous visit (from the past 240 hour(s)).  RADIOLOGY STUDIES/RESULTS: Ct Head Wo Contrast  11/18/2014   CLINICAL DATA:  Initial evaluation for acute onset slurred speech, confusion.  EXAM: CT HEAD WITHOUT CONTRAST  TECHNIQUE: Contiguous axial images were obtained from the base of the skull through the vertex without intravenous contrast.  COMPARISON:  Prior MRI from 11/26/2009.  FINDINGS: Diffuse prominence of the CSF containing spaces is compatible with generalized cerebral atrophy. Patchy and confluent hypodensity within the periventricular and deep white matter of both cerebral hemispheres most consistent with chronic small vessel ischemic disease. Encephalomalacia within the left parietal lobe compatible with remote infarct. There is a small remote left cerebellar infarct. Probable remote lacunar infarct within the left basal ganglia.  No acute large vessel territory infarct identified. No intracranial hemorrhage. No mass lesion or midline shift. Ventricular prominence related to global parenchymal volume loss present without hydrocephalus. No extra-axial  fluid collection.  Calvarium intact. Scalp soft tissues within normal limits. No acute abnormality seen about the orbits.  Paranasal sinuses and mastoid air cells are well pneumatized.  IMPRESSION: 1. No acute intracranial process. 2. Generalized cerebral atrophy with chronic microvascular ischemic disease. 3. Remote left parietal, left basal ganglia, and left cerebellar infarcts.   Electronically Signed   By: Rise MuBenjamin  McClintock M.D.   On: 11/18/2014 03:17    Jeoffrey MassedGHIMIRE,Kavaughn Faucett, MD  [facility] Pager:[phone]  If 7PM-7AM, please contact night-coverage [email or web address] Password [email or web address] 11/18/2014, 11:28 AM   LOS: 0 days

## 2014-11-18 NOTE — ED Notes (Signed)
Niu, MD at bedside. °

## 2014-11-18 NOTE — ED Notes (Signed)
Attempted report 

## 2014-11-18 NOTE — H&P (Signed)
[facility] History and Physical  Tami Ramirez:[id number] DOB: 1929-03-09 DOA: 11/17/2014  Referring physician: ED physician PCP: Lillia Mountain, MD  Specialists:   Chief Complaint: slurred speech  HPI: Tami Ramirez is a 81 y.o. female with past medical history of coronary artery disease (s/p of stent in LAD in 1997), mitral valve repair in March of 2011 due to severe mitral regurgitation, hypertension, hyperlipidemia, diastolic congestive heart failure, PAF not on blood thinner, chronic kidney disease-stage II, s/ p of Maze surgery 10/07/09, who presents with slurred speech.  Patient is accompanied by her daughter. Per her daughter, patient had transient slurred speech at about 8:00-9:00 PM. Patient could not be understood because of slurred speech on the phone when she called her son. She seemed to be mildly confused due too. That episode lasted for about 30 minutes per her daughter, then resolved spontaneously. Currently patient does not have slurry speech, unilateral weakness, numbness or tingling sensations.  ROS: currently patient denies fever, chills, fatigue, running nose, ear pain, headaches, cough, chest pain, SOB, abdominal pain, diarrhea, constipation, dysuria, urgency, frequency, hematuria, skin rashes or leg swelling. No unilateral weakness, numbness or tingling sensations. No vision change or hearing loss.  In ED, patient was found to have negative CT had for acute abnormalities. Stable renal function. Negative UDS, negative troponin, normal temperature, no tachycardia. WBC 11.2. INR 1.21. Urinalysis showed trace amount of leukocytes. EKG showed occasional PVC and PAC. Patient is admitted to inpatient for further evaluation and treatment.  Review of Systems: As presented in the history of presenting illness, rest negative.  Where does patient live?  At home Can patient participate in ADLs? Yes  Allergy:  Allergies  Allergen Reactions  . Codeine Nausea Only   . Darvocet [Propoxyphene N-Acetaminophen] Other (See Comments)    Spaced out  . Lisinopril Cough  . Morphine Sulfate Other (See Comments)    Low heart rate   . Spironolactone Other (See Comments)    hyperkalemia  . Penicillins Rash    Past Medical History  Diagnosis Date  . S/P mitral valve repair 10/07/2009  . S/P Maze operation for atrial fibrillation 10/07/2009  . Essential hypertension, benign   . ASCVD (arteriosclerotic cardiovascular disease)     SV,AWMI June 97, PTCA/stent LAD 1997, PTCA diagonal 08/07  . Exertional dyspnea     Chronic  . Overweight   . Chronic diastolic heart failure   . Atrial fibrillation   . Chronic kidney disease   . Osteoporosis   . Equilibrium disorder     Chronic  . Stroke     Past Surgical History  Procedure Laterality Date  . Right miniature thoracotomy for mitral valve repair  10/07/2009    28-mm Sorin Memo 3-D ring annuloplasty  . Cox maze procedure and ligation of left femoral av fistula  10/07/2009    Dr Cornelius Moras  . Total abdominal hysterectomy w/ bilateral salpingoophorectomy    . Appendectomy      Social History:  reports that she has never smoked. She has never used smokeless tobacco. She reports that she does not drink alcohol or use illicit drugs.  Family History:  Family History  Problem Relation Age of Onset  . Stroke Mother   . Stroke Father   . Bone cancer Brother      Prior to Admission medications   Medication Sig Start Date End Date Taking? Authorizing Provider  acetaminophen (TYLENOL) 325 MG tablet Take 650 mg by mouth every 6 (six) hours as needed  for mild pain.    Yes Historical Provider, MD  amLODipine (NORVASC) 10 MG tablet TAKE 1 TABLET EVERY DAY 02/05/14  Yes Jake BatheMark C Skains, MD  aspirin 81 MG tablet Take 81 mg by mouth daily.   Yes Historical Provider, MD  Calcium-Phosphorus-Vitamin D (CITRACAL +D3 PO) Take 1 tablet by mouth daily.   Yes Historical Provider, MD  furosemide (LASIX) 40 MG tablet Take 1 tablet (40 mg  total) by mouth 2 (two) times daily. Patient taking differently: Take 20 mg by mouth daily.  02/05/14  Yes Jake BatheMark C Skains, MD  ibuprofen (ADVIL,MOTRIN) 200 MG tablet Take 400 mg by mouth every 4 (four) hours as needed for moderate pain.    Yes Historical Provider, MD  losartan (COZAAR) 100 MG tablet TAKE 1 TABLET EVERY DAY 02/05/14  Yes Jake BatheMark C Skains, MD  Multiple Vitamin (MULTIVITAMIN) capsule Take 1 capsule by mouth daily.   Yes Historical Provider, MD  nitroGLYCERIN (NITROSTAT) 0.4 MG SL tablet Place 1 tablet (0.4 mg total) under the tongue every 5 (five) minutes as needed. 02/05/14  Yes Jake BatheMark C Skains, MD  pravastatin (PRAVACHOL) 40 MG tablet TAKE ONE TABLET BY MOUTH EVERY DAY 02/05/14  Yes Jake BatheMark C Skains, MD  spironolactone (ALDACTONE) 25 MG tablet TAKE ONE-HALF (1/2) TABLET (12.5MG ) ONCEDAILY Patient not taking: Reported on 11/18/2014 02/05/14   Jake BatheMark C Skains, MD    Physical Exam: Filed Vitals:   11/18/14 0330 11/18/14 0345 11/18/14 0415 11/18/14 0430  BP: 155/80 165/79 178/69 164/73  Pulse: 83 85 86 88  Temp:      TempSrc:      Resp: 24 25 25 28   SpO2: 94% 93% 93% 93%   General: Not in acute distress HEENT:       Eyes: PERRL, EOMI, no scleral icterus       ENT: No discharge from the ears and nose, no pharynx injection, no tonsillar enlargement.        Neck: No JVD, no bruit, no mass felt. Cardiac: S1/S2, RRR, No murmurs, No gallops or rubs Pulm: Good air movement bilaterally. Clear to auscultation bilaterally. No rales, wheezing, rhonchi or rubs. Abd: Soft, nondistended, nontender, no rebound pain, no organomegaly, BS present Ext: No edema bilaterally. 2+DP/PT pulse bilaterally Musculoskeletal: No joint deformities, erythema, or stiffness, ROM full Skin: No rashes.  Neuro: Alert and oriented X3, cranial nerves II-XII grossly intact, muscle strength 5/5 in all extremeties, sensation to light touch intact. Brachial reflex 2+ bilaterally. Knee reflex 1+ bilaterally. Negative Babinski's sign.  Normal finger to nose test. Psych: Patient is not psychotic, no suicidal or hemocidal ideation.  Labs on Admission:  Basic Metabolic Panel:  Recent Labs Lab 11/17/14 2318  NA 139  K 3.7  CL 105  CO2 22  GLUCOSE 95  BUN 16  CREATININE 0.81  CALCIUM 9.3   Liver Function Tests:  Recent Labs Lab 11/17/14 2318  AST 25  ALT 14  ALKPHOS 85  BILITOT 0.6  PROT 6.7  ALBUMIN 3.8   No results for input(s): LIPASE, AMYLASE in the last 168 hours. No results for input(s): AMMONIA in the last 168 hours. CBC:  Recent Labs Lab 11/17/14 2318  WBC 11.2*  NEUTROABS 7.9*  HGB 13.6  HCT 41.8  MCV 90.1  PLT 196   Cardiac Enzymes: No results for input(s): CKTOTAL, CKMB, CKMBINDEX, TROPONINI in the last 168 hours.  BNP (last 3 results) No results for input(s): BNP in the last 8760 hours.  ProBNP (last 3 results) No results  for input(s): PROBNP in the last 8760 hours.  CBG:  Recent Labs Lab 11/17/14 2315  GLUCAP 104*    Radiological Exams on Admission: Ct Head Wo Contrast  11/18/2014   CLINICAL DATA:  Initial evaluation for acute onset slurred speech, confusion.  EXAM: CT HEAD WITHOUT CONTRAST  TECHNIQUE: Contiguous axial images were obtained from the base of the skull through the vertex without intravenous contrast.  COMPARISON:  Prior MRI from 11/26/2009.  FINDINGS: Diffuse prominence of the CSF containing spaces is compatible with generalized cerebral atrophy. Patchy and confluent hypodensity within the periventricular and deep white matter of both cerebral hemispheres most consistent with chronic small vessel ischemic disease. Encephalomalacia within the left parietal lobe compatible with remote infarct. There is a small remote left cerebellar infarct. Probable remote lacunar infarct within the left basal ganglia.  No acute large vessel territory infarct identified. No intracranial hemorrhage. No mass lesion or midline shift. Ventricular prominence related to global  parenchymal volume loss present without hydrocephalus. No extra-axial fluid collection.  Calvarium intact. Scalp soft tissues within normal limits. No acute abnormality seen about the orbits.  Paranasal sinuses and mastoid air cells are well pneumatized.  IMPRESSION: 1. No acute intracranial process. 2. Generalized cerebral atrophy with chronic microvascular ischemic disease. 3. Remote left parietal, left basal ganglia, and left cerebellar infarcts.   Electronically Signed   By: Rise Mu M.D.   On: 11/18/2014 03:17    EKG: Independently reviewed.  Abnormal findings:  Occasional PAC and PVC.   Assessment/Plan Principal Problem:   TIA (transient ischemic attack) Active Problems:   S/P mitral valve repair   S/P Maze operation for atrial fibrillation   Coronary atherosclerosis of native coronary artery   Pure hypercholesterolemia   Mitral valve disorder   Atrial fibrillation   Chronic diastolic heart failure  TIA: Patient's symptoms are most consistent with the TIA. Currently patient is asymptomatic.  - will admit to tele bed and do TIA work up  - Risk factor modification: HgbA1c, fasting lipid panel  - MRI, MRA of the brain without contrast  - PT consult, OT consult, Speech consult  - 2 d Echocardiogram  - Ekg  - Carotid dopplers  - increased Aspirin dose from 81 to  325 mg daily  Atrial fibrillation-paroxysmal: CHA2DS2-VASc Score is 6, needs oral anticoagulation, but patient is not on blood thinner. Heart rate is well controlled. Per his cardiologist, Dr. Minerva Fester note on 02/05/14, she did not have recurrent Afib recently, only needs observation.  -continue ASA  Hypertension:  -Continue amlodipine, Cozaar  dCHF: 2d echo on 09/13/11 showed EF for 50-55%. Patient is on Lasix 40 mg twice a day at home. Her volume status seems to be okay on admission. -hold lasix  -check BNP  Hyperlipidemia: No LDL record -Continue pravastatin -Follow-up FLP  CAD: No chest pain -continue  ASA and Lipitor, when necessary nitroglycerin  DVT ppx: SQ Heparin       Code Status: Full code Family Communication:   Yes, patient's daugter      at bed side Disposition Plan: Admit to inpatient   Date of Service 11/18/2014    Lorretta Harp [facility] Pager [phone]  If 7PM-7AM, please contact night-coverage [email or web address] Password [facility] 11/18/2014, 4:51 AM

## 2014-11-19 ENCOUNTER — Inpatient Hospital Stay (HOSPITAL_COMMUNITY): Payer: Medicare Other

## 2014-11-19 ENCOUNTER — Telehealth: Payer: Self-pay | Admitting: Physician Assistant

## 2014-11-19 DIAGNOSIS — E785 Hyperlipidemia, unspecified: Secondary | ICD-10-CM

## 2014-11-19 DIAGNOSIS — I10 Essential (primary) hypertension: Secondary | ICD-10-CM

## 2014-11-19 DIAGNOSIS — I5032 Chronic diastolic (congestive) heart failure: Secondary | ICD-10-CM

## 2014-11-19 DIAGNOSIS — I639 Cerebral infarction, unspecified: Secondary | ICD-10-CM

## 2014-11-19 LAB — HEMOGLOBIN A1C
HEMOGLOBIN A1C: 5.5 % (ref 4.8–5.6)
MEAN PLASMA GLUCOSE: 111 mg/dL

## 2014-11-19 MED ORDER — APIXABAN 5 MG PO TABS: 5.0000 mg | ORAL_TABLET | Freq: Two times a day (BID) | ORAL | Status: DC

## 2014-11-19 NOTE — Progress Notes (Signed)
Talked to patient's daughter Misty StanleyLisa via phone at length about DCP; Misty StanleyLisa wants the patient to go to a ALF or SNF at discharge; CM explained to the daughter that the patient needs to qualify for placement and at this time she is only needing HHC services. Social Worker Dysheka talked to the daughter also to explain the process. Misty StanleyLisa wanted the patient to be held in the hospital a few more days; CM explained the process to the daughter that when the patient is medically stable for discharge and the Attending MD is in agreement for discharge, the insurance company will stop payment and the patient will be billed privately for not needing to be in the hospital; CM offered to give a private sitter list to assist in her care at home for private pay; Misty StanleyLisa stated that the patient cannot afford this;  CM talked to Misty StanleyLisa, the [facility];  Eliquis coupon card given to the son, activation required by the patient prior to use for a 30 day supply for $10; Benefit check for Eliquis per patient's insurance is $40.00;  Lots of emotional support given to Misty StanleyLisa and the patient's son about DCP  Misty StanleyLisa also talked to the Attending MD at length about DCP/ all questions answered, per MD Misty StanleyLisa was informed of possible discharge yesterday 11/18/2014; Abelino DerrickB Charna Neeb RN,BSN,MHA [phone]

## 2014-11-19 NOTE — Progress Notes (Signed)
Discharge Note  Patient informed about discharge instructions and discharge medications. All IV access and Telemetry monitor are discontinued. Pt discharged to home on [place] by staff. All pts belongings sent with pt.  Tami Ramirez, Tami Ramirez 11/19/2014 4:41 PM

## 2014-11-19 NOTE — Progress Notes (Signed)
Just informed by case management-the patient's primary caregiver/daughter requesting SNF/ALF placement. I spoke with patient's daughter over the phone, explained to her that during my last conversation with her yesterday she actually wanted to come and get her mother home and did not convey to me concerns about discharge. I have asked case management to confer with social work  to see if patient is a candidate for discharge to ALF/SNF.

## 2014-11-19 NOTE — Discharge Summary (Signed)
Physician Discharge Summary  Tami Ramirez ZOX:[id number] DOB: 1929-01-16 DOA: 11/17/2014  PCP: Lillia Mountain, MD  Admit date: 11/17/2014 Discharge date: 11/19/2014  Time spent: 35 minutes  Recommendations for Outpatient Follow-up:  1. Follow up with Cardiologist, Dr. Anne Fu. 2. Outpatient Neurology follow up in 8 wks.  3. New Medications:Eliquis. Aspirin discontinued.   Discharge Diagnoses:  Principal Problem:   TIA (transient ischemic attack) Active Problems:   S/P mitral valve repair   S/P Maze operation for atrial fibrillation   Coronary atherosclerosis of native coronary artery   Pure hypercholesterolemia   Mitral valve disorder   Atrial fibrillation   Chronic diastolic heart failure   Discharge Condition: Stable  Diet recommendation: Heart healthy  Filed Weights   11/18/14 0531  Weight: 61.9 kg (136 lb 7.4 oz)    History of present illness:  Pt presented to ED on 4/18 after having difficulty driving home and family found her to be confused with slurred speech. Her symptoms nearly completely resolved prior to her admission. She has a history of paroxysmal afib that was being treated with aspirin, no hx of strokes.   Hospital Course:  TIA: Symptoms resolved prior to admission.  MRI of head reveals 1 cm L frontal lobe acute infarct, CT head negative. Pt started on Eliquis and aspirin discontinued per her cardiologist request.   Hx of paroxysmal a-fib: Pt remained in sinus rhythm while admitted. CHADS2VASC score of 6. Begun on Eliquis.  2D echo benign and shows intact repair of mitral valve.   HTN: Moderately controlled HTN, continue on home medications.   HLD: Well controlled lipds, continue on home medications  Chronic diastolic HF: 2D echo benign and shows EF of 50%.  Hx of CAD: Asymptomatic, after discussion with cardiology-stop ASA-as will be on Eliquis.  Procedures:  2D echo  Carotid  duplex  Consultations:  Neurology  OT  PT  Swallow eval  Discharge Exam: Filed Vitals:   11/19/14 1431  BP: 163/73  Pulse: 82  Temp: 97.3 F (36.3 C)  Resp: 20    General: A&Ox4. NAD, nontoxic appearance Cardiovascular: RRR. No m/r/g Respiratory: CTAB. No wheezes, rales, rhonchi  Discharge Instructions   Discharge Instructions    Diet - low sodium heart healthy    Complete by:  As directed      Increase activity slowly    Complete by:  As directed           Current Discharge Medication List    START taking these medications   Details  apixaban (ELIQUIS) 5 MG TABS tablet Take 1 tablet (5 mg total) by mouth 2 (two) times daily. Qty: 120 tablet, Refills: 0      CONTINUE these medications which have NOT CHANGED   Details  acetaminophen (TYLENOL) 325 MG tablet Take 650 mg by mouth every 6 (six) hours as needed for mild pain.     amLODipine (NORVASC) 10 MG tablet TAKE 1 TABLET EVERY DAY Qty: 30 tablet, Refills: 11    Calcium-Phosphorus-Vitamin D (CITRACAL +D3 PO) Take 1 tablet by mouth daily.    furosemide (LASIX) 40 MG tablet Take 1 tablet (40 mg total) by mouth 2 (two) times daily. Qty: 60 tablet, Refills: 11    losartan (COZAAR) 100 MG tablet TAKE 1 TABLET EVERY DAY Qty: 30 tablet, Refills: 11    Multiple Vitamin (MULTIVITAMIN) capsule Take 1 capsule by mouth daily.    nitroGLYCERIN (NITROSTAT) 0.4 MG SL tablet Place 1 tablet (0.4 mg total) under the tongue every 5 (  five) minutes as needed. Qty: 25 tablet, Refills: prn    pravastatin (PRAVACHOL) 40 MG tablet TAKE ONE TABLET BY MOUTH EVERY DAY Qty: 30 tablet, Refills: 11      STOP taking these medications     aspirin 81 MG tablet      ibuprofen (ADVIL,MOTRIN) 200 MG tablet      spironolactone (ALDACTONE) 25 MG tablet        Allergies  Allergen Reactions  . Codeine Nausea Only  . Darvocet [Propoxyphene N-Acetaminophen] Other (See Comments)    Spaced out  . Lisinopril Cough  . Morphine  Sulfate Other (See Comments)    Low heart rate   . Spironolactone Other (See Comments)    hyperkalemia  . Penicillins Rash   Follow-up Information    Follow up with Donato Schultz, MD. Schedule an appointment as soon as possible for a visit in 1 week.   Specialty:  Cardiology   Contact information:   [address] [place] [address] [phone]       Follow up with Lillia Mountain, MD. Schedule an appointment as soon as possible for a visit in 1 week.   Specialty:  Internal Medicine   Contact information:   [address] [place] [address] [phone]       Follow up with SETHI,PRAMOD, MD. Schedule an appointment as soon as possible for a visit in 2 months.   Specialties:  Neurology, Radiology   Contact information:   [address] [place] [address] [phone]        The results of significant diagnostics from this hospitalization (including imaging, microbiology, ancillary and laboratory) are listed below for reference.    Significant Diagnostic Studies: Ct Head Wo Contrast  11/18/2014   CLINICAL DATA:  Initial evaluation for acute onset slurred speech, confusion.  EXAM: CT HEAD WITHOUT CONTRAST  TECHNIQUE: Contiguous axial images were obtained from the base of the skull through the vertex without intravenous contrast.  COMPARISON:  Prior MRI from 11/26/2009.  FINDINGS: Diffuse prominence of the CSF containing spaces is compatible with generalized cerebral atrophy. Patchy and confluent hypodensity within the periventricular and deep white matter of both cerebral hemispheres most consistent with chronic small vessel ischemic disease. Encephalomalacia within the left parietal lobe compatible with remote infarct. There is a small remote left cerebellar infarct. Probable remote lacunar infarct within the left basal ganglia.  No acute large vessel territory infarct identified. No intracranial hemorrhage. No mass lesion or  midline shift. Ventricular prominence related to global parenchymal volume loss present without hydrocephalus. No extra-axial fluid collection.  Calvarium intact. Scalp soft tissues within normal limits. No acute abnormality seen about the orbits.  Paranasal sinuses and mastoid air cells are well pneumatized.  IMPRESSION: 1. No acute intracranial process. 2. Generalized cerebral atrophy with chronic microvascular ischemic disease. 3. Remote left parietal, left basal ganglia, and left cerebellar infarcts.   Electronically Signed   By: Rise Mu M.D.   On: 11/18/2014 03:17   Mr Brain Wo Contrast  11/19/2014   CLINICAL DATA:  Slurred speech. History of mitral valve repair, hypertension, atrial fibrillation, CHF, chronic kidney disease.  EXAM: MRI HEAD WITHOUT CONTRAST  MRA HEAD WITHOUT CONTRAST  TECHNIQUE: Multiplanar, multiecho pulse sequences of the brain and surrounding structures were obtained without intravenous contrast. Angiographic images of the head were obtained using MRA technique without contrast.  COMPARISON:  CT of the head October 18, 2014 and MRI of the brain November 26, 2012 and MRA of the head November 12, 2009  FINDINGS: MRI HEAD FINDINGS  Curvilinear reduced diffusion in LEFT frontal lobe measuring up to 10 mm post corresponding low ADC values.  RIGHT frontal encephalomalacia with susceptibility artifact, minimal LEFT inferior frontal lobe encephalomalacia. LEFT parietal occipital lobe patchy encephalomalacia. Remote small LEFT cerebellar infarct. Ex vacuo dilatation of the RIGHT greater LEFT frontal horn of the lateral ventricles a background of moderate to severe ventriculomegaly likely on the global parenchymal brain volume loss as there is overall commensurate enlargement of cerebral sulci and cerebellar folia. Confluent supratentorial and pontine white matter FLAIR T2 hyperintensities without midline shift or mass effect. Remote LEFT basal ganglia lacunar infarct was present previously.  Additional tiny T2 hyperintensities in the basal ganglia favor perivascular spaces. A few punctate foci of susceptibility artifact within the posterior fossa noted and may reflect sequelae of hypertension.  No abnormal extra-axial fluid collections. Status post bilateral ocular lens implants. Atretic maxillary suggest sequela of chronic sinusitis without. Mastoid air cells are well aerated. No abnormal sellar expansion. No cerebellar tonsillar ectopia. No suspicious calvarial bone marrow signal. Patient is edentulous.  MRA HEAD FINDINGS  Anterior circulation: dolicoectatic vessels. Normal flow early enhancement of the cervical, petrous, cavernous and supra clinoid internal carotid arteries, however there is mild luminal irregularity of the cavernous segment corresponding to calcific atherosclerosis. LEFT A1 segment is dominant, patent anterior communicating artery with flow related enhancement of the anterior and middle cerebral arteries. Focal high-grade stenosis of the origin of a LEFT M2 segment. Occluded LEFT M 2/3 segment suspected though limited assessment for distal disease.  Posterior circulation: RIGHT vertebral artery is dominant. Normal flow related enhancement of the vertebral arteries, vertebrobasilar junction, basilar artery and main branch vessels pedal. Fetal origin of the RIGHT posterior cerebral artery, normal flow early enhancement of the posterior cerebral arteries.  No large vessel occlusion nor aneurysm. Mild to moderate luminal irregularity of the mid to distal intracranial vessels most consistent with atherosclerosis.  IMPRESSION: MRI HEAD: 1 cm LEFT frontal lobe acute infarct.  Moderate to severe atrophy. RIGHT frontal lobe encephalomalacia may reflect remote trauma/hemorrhage with minimal LEFT inferior frontal lobe encephalomalacia. LEFT parietal occipital lobe infarct may be posttraumatic or ischemic.  Severe white matter changes suggest chronic small vessel ischemic disease. Remote LEFT  basal ganglia lacunar infarct.  MRA HEAD: High-grade stenosis origin of LEFT M2 origin in addition to suspected occlusion LEFT M2-3 segment though, limited assessment due to distal location.  Mild to moderate luminal irregularity of the intracranial vessels most consistent with atherosclerosis.   Electronically Signed   By: Awilda Metroourtnay  Bloomer   On: 11/19/2014 01:32   Mr Maxine GlennMra Head/brain Wo Cm  11/19/2014   CLINICAL DATA:  Slurred speech. History of mitral valve repair, hypertension, atrial fibrillation, CHF, chronic kidney disease.  EXAM: MRI HEAD WITHOUT CONTRAST  MRA HEAD WITHOUT CONTRAST  TECHNIQUE: Multiplanar, multiecho pulse sequences of the brain and surrounding structures were obtained without intravenous contrast. Angiographic images of the head were obtained using MRA technique without contrast.  COMPARISON:  CT of the head October 18, 2014 and MRI of the brain November 26, 2012 and MRA of the head November 12, 2009  FINDINGS: MRI HEAD FINDINGS  Curvilinear reduced diffusion in LEFT frontal lobe measuring up to 10 mm post corresponding low ADC values.  RIGHT frontal encephalomalacia with susceptibility artifact, minimal LEFT inferior frontal lobe encephalomalacia. LEFT parietal occipital lobe patchy encephalomalacia. Remote small LEFT cerebellar infarct. Ex vacuo dilatation of the  RIGHT greater LEFT frontal horn of the lateral ventricles a background of moderate to severe ventriculomegaly likely on the global parenchymal brain volume loss as there is overall commensurate enlargement of cerebral sulci and cerebellar folia. Confluent supratentorial and pontine white matter FLAIR T2 hyperintensities without midline shift or mass effect. Remote LEFT basal ganglia lacunar infarct was present previously. Additional tiny T2 hyperintensities in the basal ganglia favor perivascular spaces. A few punctate foci of susceptibility artifact within the posterior fossa noted and may reflect sequelae of hypertension.  No abnormal  extra-axial fluid collections. Status post bilateral ocular lens implants. Atretic maxillary suggest sequela of chronic sinusitis without. Mastoid air cells are well aerated. No abnormal sellar expansion. No cerebellar tonsillar ectopia. No suspicious calvarial bone marrow signal. Patient is edentulous.  MRA HEAD FINDINGS  Anterior circulation: dolicoectatic vessels. Normal flow early enhancement of the cervical, petrous, cavernous and supra clinoid internal carotid arteries, however there is mild luminal irregularity of the cavernous segment corresponding to calcific atherosclerosis. LEFT A1 segment is dominant, patent anterior communicating artery with flow related enhancement of the anterior and middle cerebral arteries. Focal high-grade stenosis of the origin of a LEFT M2 segment. Occluded LEFT M 2/3 segment suspected though limited assessment for distal disease.  Posterior circulation: RIGHT vertebral artery is dominant. Normal flow related enhancement of the vertebral arteries, vertebrobasilar junction, basilar artery and main branch vessels pedal. Fetal origin of the RIGHT posterior cerebral artery, normal flow early enhancement of the posterior cerebral arteries.  No large vessel occlusion nor aneurysm. Mild to moderate luminal irregularity of the mid to distal intracranial vessels most consistent with atherosclerosis.  IMPRESSION: MRI HEAD: 1 cm LEFT frontal lobe acute infarct.  Moderate to severe atrophy. RIGHT frontal lobe encephalomalacia may reflect remote trauma/hemorrhage with minimal LEFT inferior frontal lobe encephalomalacia. LEFT parietal occipital lobe infarct may be posttraumatic or ischemic.  Severe white matter changes suggest chronic small vessel ischemic disease. Remote LEFT basal ganglia lacunar infarct.  MRA HEAD: High-grade stenosis origin of LEFT M2 origin in addition to suspected occlusion LEFT M2-3 segment though, limited assessment due to distal location.  Mild to moderate luminal  irregularity of the intracranial vessels most consistent with atherosclerosis.   Electronically Signed   By: Awilda Metro   On: 11/19/2014 01:32    Microbiology: No results found for this or any previous visit (from the past 240 hour(s)).   Labs: Basic Metabolic Panel:  Recent Labs Lab 11/17/14 2318  NA 139  K 3.7  CL 105  CO2 22  GLUCOSE 95  BUN 16  CREATININE 0.81  CALCIUM 9.3   Liver Function Tests:  Recent Labs Lab 11/17/14 2318  AST 25  ALT 14  ALKPHOS 85  BILITOT 0.6  PROT 6.7  ALBUMIN 3.8   No results for input(s): LIPASE, AMYLASE in the last 168 hours. No results for input(s): AMMONIA in the last 168 hours. CBC:  Recent Labs Lab 11/17/14 2318  WBC 11.2*  NEUTROABS 7.9*  HGB 13.6  HCT 41.8  MCV 90.1  PLT 196   Cardiac Enzymes: No results for input(s): CKTOTAL, CKMB, CKMBINDEX, TROPONINI in the last 168 hours. BNP: BNP (last 3 results)  Recent Labs  11/18/14 0600  BNP 432.5*    ProBNP (last 3 results) No results for input(s): PROBNP in the last 8760 hours.  CBG:  Recent Labs Lab 11/17/14 2315  GLUCAP 104*     Signed: Minda Ditto, PA-S Jeoffrey Massed, MD  [facility] 11/19/2014, 3:40 PM  Attending Patient was seen, examined,treatment plan was discussed with PA-s.  I have directly reviewed the clinical findings, lab, imaging studies and management of this patient in detail. I have made the necessary changes to the above noted documentation, and agree with the documentation  Patient is an 81 year old female with history of paroxysmal atrial fibrillation admitted with transient slurred speech. Initially thought to have a TIA, however MRI of the brain shows acute left frontal lobe infarct. Given history atrial fibrillation, case was discussed by this M.D. with patient's cardiologist-Dr. Anne Fu, who recommended that we stop aspirin and start Eliquis. Rest as above. Being discharged home in a stable manner with home health  services.  Tami Norfolk MD [facility].

## 2014-11-19 NOTE — Progress Notes (Signed)
PT Cancellation Note  Patient Details Name: Tami Ramirez W Men MRN: [id number] DOB: 05/13/1929   Cancelled Treatment:    Reason Eval/Treat Not Completed: Patient not medically ready (patient still on bedrest orders)   Fabio AsaWerner, Mattthew Ziomek J 11/19/2014, 8:27 AM Charlotte Crumbevon Joshiah Traynham, PT DPT  [phone]

## 2014-11-19 NOTE — Consult Note (Signed)
Referring Physician: Ghimire    Chief Complaint: Stroke  HPI:                                                                                                                                         Tami Ramirez is an 81 y.o. female presenting to hospital after daughter noted she was slurring her speech while on the phone.  This was very transient and only lasted for about one hour. Patient was brought to hospital for further evaluation.  MRI obtained does show a left frontal lobe infarct. Currently patient is asymptotic and has no complaints.  She notes she has been on baby ASA and takes this daily.   Date last known well: Date: 11/17/2014 Time last known well: Unable to determine tPA Given: No: no LNW Modified Rankin: Rankin Score=0    Past Medical History  Diagnosis Date  . S/P mitral valve repair 10/07/2009  . S/P Maze operation for atrial fibrillation 10/07/2009  . Essential hypertension, benign   . ASCVD (arteriosclerotic cardiovascular disease)     SV,AWMI June 97, PTCA/stent LAD 1997, PTCA diagonal 08/07  . Exertional dyspnea     Chronic  . Overweight   . Chronic diastolic heart failure   . Atrial fibrillation   . Chronic kidney disease   . Osteoporosis   . Equilibrium disorder     Chronic  . Stroke     Past Surgical History  Procedure Laterality Date  . Right miniature thoracotomy for mitral valve repair  10/07/2009    28-mm Sorin Memo 3-D ring annuloplasty  . Cox maze procedure and ligation of left femoral av fistula  10/07/2009    Dr Cornelius Moras  . Total abdominal hysterectomy w/ bilateral salpingoophorectomy    . Appendectomy      Family History  Problem Relation Age of Onset  . Stroke Mother   . Stroke Father   . Bone cancer Brother    Social History:  reports that she has never smoked. She has never used smokeless tobacco. She reports that she does not drink alcohol or use illicit drugs.  Allergies:  Allergies  Allergen Reactions  . Codeine Nausea Only  .  Darvocet [Propoxyphene N-Acetaminophen] Other (See Comments)    Spaced out  . Lisinopril Cough  . Morphine Sulfate Other (See Comments)    Low heart rate   . Spironolactone Other (See Comments)    hyperkalemia  . Penicillins Rash    Medications:  Prior to Admission:  Prescriptions prior to admission  Medication Sig Dispense Refill Last Dose  . acetaminophen (TYLENOL) 325 MG tablet Take 650 mg by mouth every 6 (six) hours as needed for mild pain.    Past Week at Unknown time  . amLODipine (NORVASC) 10 MG tablet TAKE 1 TABLET EVERY DAY 30 tablet 11 11/17/2014 at Unknown time  . aspirin 81 MG tablet Take 81 mg by mouth daily.   11/17/2014 at Unknown time  . Calcium-Phosphorus-Vitamin D (CITRACAL +D3 PO) Take 1 tablet by mouth daily.   11/17/2014 at Unknown time  . furosemide (LASIX) 40 MG tablet Take 1 tablet (40 mg total) by mouth 2 (two) times daily. (Patient taking differently: Take 20 mg by mouth daily. ) 60 tablet 11 11/17/2014 at Unknown time  . ibuprofen (ADVIL,MOTRIN) 200 MG tablet Take 400 mg by mouth every 4 (four) hours as needed for moderate pain.    Past Week at Unknown time  . losartan (COZAAR) 100 MG tablet TAKE 1 TABLET EVERY DAY 30 tablet 11 11/17/2014 at Unknown time  . Multiple Vitamin (MULTIVITAMIN) capsule Take 1 capsule by mouth daily.   11/17/2014 at Unknown time  . nitroGLYCERIN (NITROSTAT) 0.4 MG SL tablet Place 1 tablet (0.4 mg total) under the tongue every 5 (five) minutes as needed. 25 tablet prn couple years  . pravastatin (PRAVACHOL) 40 MG tablet TAKE ONE TABLET BY MOUTH EVERY DAY 30 tablet 11 11/17/2014 at Unknown time  . spironolactone (ALDACTONE) 25 MG tablet TAKE ONE-HALF (1/2) TABLET (12.5MG ) ONCEDAILY (Patient not taking: Reported on 11/18/2014) 15 tablet 11    Scheduled: .  stroke: mapping our early stages of recovery book   Does not apply Once   . amLODipine  10 mg Oral Daily  . apixaban  5 mg Oral BID  . calcium-vitamin D  1 tablet Oral Daily  . losartan  100 mg Oral Daily  . multivitamin with minerals  1 tablet Oral Daily  . pravastatin  40 mg Oral Daily    ROS:                                                                                                                                       History obtained from the patient  General ROS: negative for - chills, fatigue, fever, night sweats, weight gain or weight loss Psychological ROS: negative for - behavioral disorder, hallucinations, memory difficulties, mood swings or suicidal ideation Ophthalmic ROS: negative for - blurry vision, double vision, eye pain or loss of vision ENT ROS: negative for - epistaxis, nasal discharge, oral lesions, sore throat, tinnitus or vertigo Allergy and Immunology ROS: negative for - hives or itchy/watery eyes Hematological and Lymphatic ROS: negative for - bleeding problems, bruising or swollen lymph nodes Endocrine ROS: negative for - galactorrhea, hair pattern changes, polydipsia/polyuria or temperature intolerance Respiratory ROS: negative for - cough, hemoptysis, shortness of breath or wheezing Cardiovascular  ROS: negative for - chest pain, dyspnea on exertion, edema or irregular heartbeat Gastrointestinal ROS: negative for - abdominal pain, diarrhea, hematemesis, nausea/vomiting or stool incontinence Genito-Urinary ROS: negative for - dysuria, hematuria, incontinence or urinary frequency/urgency Musculoskeletal ROS: negative for - joint swelling or muscular weakness Neurological ROS: as noted in HPI Dermatological ROS: negative for rash and skin lesion changes  Neurologic Examination:                                                                                                      Blood pressure 155/73, pulse 80, temperature 97.9 F (36.6 C), temperature source Oral, resp. rate 20, weight 61.9 kg (136 lb 7.4 oz), SpO2 94 %.  HEENT-   Normocephalic, no lesions, without obvious abnormality.  Normal external eye and conjunctiva.  Normal TM's bilaterally.  Normal auditory canals and external ears. Normal external nose, mucus membranes and septum.  Normal pharynx. Cardiovascular- S1, S2 normal, pulses palpable throughout   Lungs- chest clear, no wheezing, rales, normal symmetric air entry Abdomen- normal findings: bowel sounds normal Extremities- no edema Lymph-no adenopathy palpable Musculoskeletal-no joint tenderness, deformity or swelling Skin-warm and dry, no hyperpigmentation, vitiligo, or suspicious lesions  Neurological Examination Mental Status: Alert, oriented, thought content appropriate.  Speech fluent without evidence of aphasia.  Able to follow 3 step commands without difficulty. Cranial Nerves: II: Discs flat bilaterally; Visual fields grossly normal, pupils equal, round, reactive to light and accommodation III,IV, VI: ptosis not present, extra-ocular motions intact bilaterally V,VII: smile symmetric, facial light touch sensation normal bilaterally VIII: hearing normal bilaterally IX,X: uvula rises symmetrically XI: bilateral shoulder shrug XII: midline tongue extension Motor: Right : Upper extremity   5/5    Left:     Upper extremity   5/5  Lower extremity   5/5     Lower extremity   5/5 Tone and bulk:normal tone throughout; no atrophy noted Sensory: Pinprick and light touch intact throughout, bilaterally Deep Tendon Reflexes: 2+ and symmetric throughout Plantars: Right: downgoing   Left: downgoing Cerebellar: normal finger-to-nose, normal rapid alternating movements and normal heel-to-shin test Gait: normal gait and station       Lab Results: Basic Metabolic Panel:  Recent Labs Lab 11/17/14 2318  NA 139  K 3.7  CL 105  CO2 22  GLUCOSE 95  BUN 16  CREATININE 0.81  CALCIUM 9.3    Liver Function Tests:  Recent Labs Lab 11/17/14 2318  AST 25  ALT 14  ALKPHOS 85  BILITOT 0.6   PROT 6.7  ALBUMIN 3.8   No results for input(s): LIPASE, AMYLASE in the last 168 hours. No results for input(s): AMMONIA in the last 168 hours.  CBC:  Recent Labs Lab 11/17/14 2318  WBC 11.2*  NEUTROABS 7.9*  HGB 13.6  HCT 41.8  MCV 90.1  PLT 196    Cardiac Enzymes: No results for input(s): CKTOTAL, CKMB, CKMBINDEX, TROPONINI in the last 168 hours.  Lipid Panel:  Recent Labs Lab 11/18/14 0600  CHOL 146  TRIG 70  HDL 43  CHOLHDL 3.4  VLDL 14  LDLCALC  89    CBG:  Recent Labs Lab 11/17/14 2315  GLUCAP 104*    Microbiology: Results for orders placed or performed during the hospital encounter of 10/07/09  Surgical pcr screen     Status: None   Collection Time: 10/05/09 10:23 AM  Result Value Ref Range Status   MRSA, PCR NEGATIVE NEGATIVE Final   Staphylococcus aureus  NEGATIVE Final    NEGATIVE        The Xpert SA Assay (FDA approved for NASAL specimens only), is one component of a comprehensive surveillance program.  It is not intended to diagnose infection nor to guide or monitor treatment.  MRSA PCR Screening     Status: None   Collection Time: 10/07/09  4:00 PM  Result Value Ref Range Status   MRSA by PCR  NEGATIVE Final    NEGATIVE        The GeneXpert MRSA Assay (FDA approved for NASAL specimens only), is one component of a comprehensive MRSA colonization surveillance program. It is not intended to diagnose MRSA infection nor to guide or monitor treatment for MRSA infections.    Coagulation Studies:  Recent Labs  11/17/14 2318  LABPROT 15.4*  INR 1.21    Imaging: Ct Head Wo Contrast  11/18/2014   CLINICAL DATA:  Initial evaluation for acute onset slurred speech, confusion.  EXAM: CT HEAD WITHOUT CONTRAST  TECHNIQUE: Contiguous axial images were obtained from the base of the skull through the vertex without intravenous contrast.  COMPARISON:  Prior MRI from 11/26/2009.  FINDINGS: Diffuse prominence of the CSF containing spaces is  compatible with generalized cerebral atrophy. Patchy and confluent hypodensity within the periventricular and deep white matter of both cerebral hemispheres most consistent with chronic small vessel ischemic disease. Encephalomalacia within the left parietal lobe compatible with remote infarct. There is a small remote left cerebellar infarct. Probable remote lacunar infarct within the left basal ganglia.  No acute large vessel territory infarct identified. No intracranial hemorrhage. No mass lesion or midline shift. Ventricular prominence related to global parenchymal volume loss present without hydrocephalus. No extra-axial fluid collection.  Calvarium intact. Scalp soft tissues within normal limits. No acute abnormality seen about the orbits.  Paranasal sinuses and mastoid air cells are well pneumatized.  IMPRESSION: 1. No acute intracranial process. 2. Generalized cerebral atrophy with chronic microvascular ischemic disease. 3. Remote left parietal, left basal ganglia, and left cerebellar infarcts.   Electronically Signed   By: Rise MuBenjamin  McClintock M.D.   On: 11/18/2014 03:17   Mr Brain Wo Contrast  11/19/2014   CLINICAL DATA:  Slurred speech. History of mitral valve repair, hypertension, atrial fibrillation, CHF, chronic kidney disease.  EXAM: MRI HEAD WITHOUT CONTRAST  MRA HEAD WITHOUT CONTRAST  TECHNIQUE: Multiplanar, multiecho pulse sequences of the brain and surrounding structures were obtained without intravenous contrast. Angiographic images of the head were obtained using MRA technique without contrast.  COMPARISON:  CT of the head October 18, 2014 and MRI of the brain November 26, 2012 and MRA of the head November 12, 2009  FINDINGS: MRI HEAD FINDINGS  Curvilinear reduced diffusion in LEFT frontal lobe measuring up to 10 mm post corresponding low ADC values.  RIGHT frontal encephalomalacia with susceptibility artifact, minimal LEFT inferior frontal lobe encephalomalacia. LEFT parietal occipital lobe patchy  encephalomalacia. Remote small LEFT cerebellar infarct. Ex vacuo dilatation of the RIGHT greater LEFT frontal horn of the lateral ventricles a background of moderate to severe ventriculomegaly likely on the global parenchymal brain volume loss as  there is overall commensurate enlargement of cerebral sulci and cerebellar folia. Confluent supratentorial and pontine white matter FLAIR T2 hyperintensities without midline shift or mass effect. Remote LEFT basal ganglia lacunar infarct was present previously. Additional tiny T2 hyperintensities in the basal ganglia favor perivascular spaces. A few punctate foci of susceptibility artifact within the posterior fossa noted and may reflect sequelae of hypertension.  No abnormal extra-axial fluid collections. Status post bilateral ocular lens implants. Atretic maxillary suggest sequela of chronic sinusitis without. Mastoid air cells are well aerated. No abnormal sellar expansion. No cerebellar tonsillar ectopia. No suspicious calvarial bone marrow signal. Patient is edentulous.  MRA HEAD FINDINGS  Anterior circulation: dolicoectatic vessels. Normal flow early enhancement of the cervical, petrous, cavernous and supra clinoid internal carotid arteries, however there is mild luminal irregularity of the cavernous segment corresponding to calcific atherosclerosis. LEFT A1 segment is dominant, patent anterior communicating artery with flow related enhancement of the anterior and middle cerebral arteries. Focal high-grade stenosis of the origin of a LEFT M2 segment. Occluded LEFT M 2/3 segment suspected though limited assessment for distal disease.  Posterior circulation: RIGHT vertebral artery is dominant. Normal flow related enhancement of the vertebral arteries, vertebrobasilar junction, basilar artery and main branch vessels pedal. Fetal origin of the RIGHT posterior cerebral artery, normal flow early enhancement of the posterior cerebral arteries.  No large vessel occlusion nor  aneurysm. Mild to moderate luminal irregularity of the mid to distal intracranial vessels most consistent with atherosclerosis.  IMPRESSION: MRI HEAD: 1 cm LEFT frontal lobe acute infarct.  Moderate to severe atrophy. RIGHT frontal lobe encephalomalacia may reflect remote trauma/hemorrhage with minimal LEFT inferior frontal lobe encephalomalacia. LEFT parietal occipital lobe infarct may be posttraumatic or ischemic.  Severe white matter changes suggest chronic small vessel ischemic disease. Remote LEFT basal ganglia lacunar infarct.  MRA HEAD: High-grade stenosis origin of LEFT M2 origin in addition to suspected occlusion LEFT M2-3 segment though, limited assessment due to distal location.  Mild to moderate luminal irregularity of the intracranial vessels most consistent with atherosclerosis.   Electronically Signed   By: Awilda Metro   On: 11/19/2014 01:32   Mr Maxine Glenn Head/brain Wo Cm  11/19/2014   CLINICAL DATA:  Slurred speech. History of mitral valve repair, hypertension, atrial fibrillation, CHF, chronic kidney disease.  EXAM: MRI HEAD WITHOUT CONTRAST  MRA HEAD WITHOUT CONTRAST  TECHNIQUE: Multiplanar, multiecho pulse sequences of the brain and surrounding structures were obtained without intravenous contrast. Angiographic images of the head were obtained using MRA technique without contrast.  COMPARISON:  CT of the head October 18, 2014 and MRI of the brain November 26, 2012 and MRA of the head November 12, 2009  FINDINGS: MRI HEAD FINDINGS  Curvilinear reduced diffusion in LEFT frontal lobe measuring up to 10 mm post corresponding low ADC values.  RIGHT frontal encephalomalacia with susceptibility artifact, minimal LEFT inferior frontal lobe encephalomalacia. LEFT parietal occipital lobe patchy encephalomalacia. Remote small LEFT cerebellar infarct. Ex vacuo dilatation of the RIGHT greater LEFT frontal horn of the lateral ventricles a background of moderate to severe ventriculomegaly likely on the global  parenchymal brain volume loss as there is overall commensurate enlargement of cerebral sulci and cerebellar folia. Confluent supratentorial and pontine white matter FLAIR T2 hyperintensities without midline shift or mass effect. Remote LEFT basal ganglia lacunar infarct was present previously. Additional tiny T2 hyperintensities in the basal ganglia favor perivascular spaces. A few punctate foci of susceptibility artifact within the posterior fossa noted and may reflect sequelae  of hypertension.  No abnormal extra-axial fluid collections. Status post bilateral ocular lens implants. Atretic maxillary suggest sequela of chronic sinusitis without. Mastoid air cells are well aerated. No abnormal sellar expansion. No cerebellar tonsillar ectopia. No suspicious calvarial bone marrow signal. Patient is edentulous.  MRA HEAD FINDINGS  Anterior circulation: dolicoectatic vessels. Normal flow early enhancement of the cervical, petrous, cavernous and supra clinoid internal carotid arteries, however there is mild luminal irregularity of the cavernous segment corresponding to calcific atherosclerosis. LEFT A1 segment is dominant, patent anterior communicating artery with flow related enhancement of the anterior and middle cerebral arteries. Focal high-grade stenosis of the origin of a LEFT M2 segment. Occluded LEFT M 2/3 segment suspected though limited assessment for distal disease.  Posterior circulation: RIGHT vertebral artery is dominant. Normal flow related enhancement of the vertebral arteries, vertebrobasilar junction, basilar artery and main branch vessels pedal. Fetal origin of the RIGHT posterior cerebral artery, normal flow early enhancement of the posterior cerebral arteries.  No large vessel occlusion nor aneurysm. Mild to moderate luminal irregularity of the mid to distal intracranial vessels most consistent with atherosclerosis.  IMPRESSION: MRI HEAD: 1 cm LEFT frontal lobe acute infarct.  Moderate to severe  atrophy. RIGHT frontal lobe encephalomalacia may reflect remote trauma/hemorrhage with minimal LEFT inferior frontal lobe encephalomalacia. LEFT parietal occipital lobe infarct may be posttraumatic or ischemic.  Severe white matter changes suggest chronic small vessel ischemic disease. Remote LEFT basal ganglia lacunar infarct.  MRA HEAD: High-grade stenosis origin of LEFT M2 origin in addition to suspected occlusion LEFT M2-3 segment though, limited assessment due to distal location.  Mild to moderate luminal irregularity of the intracranial vessels most consistent with atherosclerosis.   Electronically Signed   By: Awilda Metro   On: 11/19/2014 01:32    2 D echo: Study Conclusions  - Left ventricle: There is basal inferior and inferolateral wall hypokinesis as well as distal apical and apical hypokinesis. The cavity size was normal. There was severe concentric hypertrophy. Systolic function was moderately reduced. The estimated ejection fraction was 50%. Wall motion was normal; there were no regional wall motion abnormalities. Features are consistent with a pseudonormal left ventricular filling pattern, with concomitant abnormal relaxation and increased filling pressure (grade 2 diastolic dysfunction). Doppler parameters are consistent with elevated ventricular end-diastolic filling pressure. - Mitral valve: S/P mitral valve repair. Transmitral gradient normal for repair and unchanged from 2013. - Left atrium: The atrium was moderately dilated. - Right ventricle: Systolic function was normal. - Tricuspid valve: There was trivial regurgitation. - Pulmonic valve: There was trivial regurgitation. - Pulmonary arteries: Systolic pressure was mildly increased. PA peak pressure: 43 mm Hg (S).  Carotid doppler pending.  A1c 5.5 LDL 89   Assessment and plan discussed with with attending physician and they are in agreement.    Felicie Morn PA-C [facility] [phone]  11/19/2014, 10:19 AM   Assessment: 81 y.o. female with history of PAF (currently NSR) presenting with transient slurred speech. MRI personally reviewed and shows a acute left frontal lobe.  Echo has been reviewed by cardiologist and changed from ASA to Eliquis. Neuro exam is non focal.   Stroke Risk Factors - atrial fibrillation and hypertension   Recommend: 1) Carotid doppler 2) agree with Eliquis 3) Follow up with out patient neurology in 4 weeks.   Neurology S/O   Elspeth Cho, DO [facility] [phone]  If 7pm- 7am, please page neurology on call as listed in AMION.

## 2014-11-19 NOTE — Progress Notes (Signed)
VASCULAR LAB PRELIMINARY  PRELIMINARY  PRELIMINARY  PRELIMINARY  Carotid duplex completed.    Preliminary report:  Right - No evidence of significant stenosis. Vertebral artery flow is antegrade. Left - 1%to 39% ICA stenosis. Vertebral artery flow is antegrade.  Tarig Zimmers, RVS 11/19/2014, 1:45 PM

## 2014-11-19 NOTE — Telephone Encounter (Signed)
Patient's daughter called the answering service with question about Eliquis. Patient discharged from hospital on this but daughter didn't know where to get the medicine. She was unaware it was sent to [facility] per med rec - she will proceed there to pick it up. She is in the process of talking to her insurance company to find out ways to make this affordable.  She is in the process of filling out assistance paperwork for this medicine.  I asked her to let our office know if we can be of further assistance, preferably during business hours when we can help her the most. She verbalized understanding and gratitude. She will also be calling to set up a post-hospital f/u appointment tomorrow to coordinate with their schedule. Espn Zeman PA-C

## 2014-11-19 NOTE — Evaluation (Signed)
Occupational Therapy Evaluation Patient Details Name: Tami Ramirez MRN: [id number] DOB: 01-Oct-1928 Today's Date: 11/19/2014    History of Present Illness 81 y.o. female with past medical history of coronary artery disease (s/p of stent in LAD in 1997), mitral valve repair in March of 2011 due to severe mitral regurgitation, hypertension, hyperlipidemia, diastolic congestive heart failure, PAF not on blood thinner, chronic kidney disease-stage II, s/ p of Maze surgery 10/07/09, who presented with slurred speech (since resolved). Neuro work up underway.   Clinical Impression   Pt admitted with the above diagnoses and presents with below problem list. Pt will benefit from continued OT to address the below listed deficits and maximize independence with basic ADLs prior to d/c home. PTA pt was mod I reporting use of cane. Currently pt is mostly min guard level with ADLs, +1 HHA utilized for transfers and functional mobility in lieu of cane. Pt noted to have decreased smoothness tracking into left visual field also with increased eye fatigue tracking left. Pt noted to have impaired STM, no cg present to determine baseline cognition. Recommend HHOT at d/c to facilitate safety with ADLs at home. OT to continue to follow acutely.     Follow Up Recommendations  Supervision/Assistance - 24 hour;Home health OT    Equipment Recommendations  Tub/shower seat    Recommendations for Other Services       Precautions / Restrictions Precautions Precautions: Fall      Mobility Bed Mobility               General bed mobility comments: not assessed -EOB then in recliner.  Transfers Overall transfer level: Needs assistance Equipment used: 1 person hand held assist Transfers: Sit to/from Stand Sit to Stand: Supervision         General transfer comment: HHA +1 for balance/safety. pt reports use of cane at home    Balance Overall balance assessment: Needs assistance Sitting-balance  support: No upper extremity supported;Feet supported Sitting balance-Leahy Scale: Good     Standing balance support: Single extremity supported;During functional activity Standing balance-Leahy Scale: Fair Standing balance comment: Able to stand at sink with occasional balance checks                            ADL Overall ADL's : Needs assistance/impaired Eating/Feeding: Set up;Sitting   Grooming: Set up;Wash/dry hands;Standing   Upper Body Bathing: Set up;Sitting   Lower Body Bathing: Min guard;Sit to/from stand   Upper Body Dressing : Set up;Sitting   Lower Body Dressing: Min guard;Sit to/from stand   Toilet Transfer: Min guard;Minimal assistance;Ambulation;Comfort height toilet;Grab bars   Toileting- Clothing Manipulation and Hygiene: Min guard;Sit to/from stand   Tub/ Shower Transfer: Min guard;Minimal assistance;Ambulation;Shower seat   Functional mobility during ADLs: Minimal assistance General ADL Comments: Light min A during ambulation for HHA +1 to steady balance. Pt reports she uses a cane PTA. No cane present in room. Discussed shower seat recommendation to assist with bathing safely. Discussed techniques for ADLs and home setup to promote safety at home with decreased balance. Pt ambulated to the bathroom and completed toileting tasks. Pt then stood at the sinke to complete handwashing and oral care.      Vision Vision Assessment?: Yes Eye Alignment: Within Functional Limits Ocular Range of Motion: Within Functional Limits Tracking/Visual Pursuits: Decreased smoothness of eye movement to LEFT superior field;Decreased smoothness of eye movement to LEFT inferior field;Requires cues, head turns, or add eye shifts  to track Saccades: Within functional limits Visual Fields: No apparent deficits Additional Comments: Pt needing max directional cues during testing. Pt with noted eye fatigue during visual testing with short rest breaks provided. Pt reports no  blurriness or diplopia. Pt did c/o increased eye fatigue tracking to the left   Perception     Praxis      Pertinent Vitals/Pain Pain Assessment: No/denies pain     Hand Dominance Right   Extremity/Trunk Assessment Upper Extremity Assessment Upper Extremity Assessment: Generalized weakness;Overall [facility] for tasks assessed   Lower Extremity Assessment Lower Extremity Assessment: Defer to PT evaluation       Communication Communication Communication: No difficulties   Cognition Arousal/Alertness: Awake/alert Behavior During Therapy: WFL for tasks assessed/performed Overall Cognitive Status: No family/caregiver present to determine baseline cognitive functioning       Memory: Decreased short-term memory             General Comments       Exercises       Shoulder Instructions      Home Living Family/patient expects to be discharged to:: Private residence Living Arrangements: Alone Available Help at Discharge: Family Type of Home: House Home Access: Stairs to enter Secretary/administrator of Steps: 4 Entrance Stairs-Rails: Can reach both Home Layout: One level     Bathroom Shower/Tub: Tub/shower unit         Home Equipment: Environmental consultant - 2 wheels;Cane - single point   Additional Comments: wears glasses  Lives With: Alone    Prior Functioning/Environment Level of Independence: Independent with assistive device(s)        Comments: Pt reports she used a cane PTA. No cane in room.    OT Diagnosis: Cognitive deficits;Disturbance of vision;Other (comment) (impaired balance)   OT Problem List: Impaired balance (sitting and/or standing);Decreased knowledge of use of DME or AE;Decreased knowledge of precautions;Impaired vision/perception;Decreased cognition;Decreased safety awareness   OT Treatment/Interventions: Self-care/ADL training;Therapeutic exercise;DME and/or AE instruction;Therapeutic activities;Cognitive remediation/compensation;Visual/perceptual  remediation/compensation;Patient/family education;Balance training    OT Goals(Current goals can be found in the care plan section) Acute Rehab OT Goals Patient Stated Goal: to go home OT Goal Formulation: With patient Time For Goal Achievement: 11/26/14 Potential to Achieve Goals: Good ADL Goals Pt Will Perform Lower Body Dressing: with modified independence;with adaptive equipment;sit to/from stand Pt Will Transfer to Toilet: with modified independence;ambulating (3n1 over toilet) Pt Will Perform Toileting - Clothing Manipulation and hygiene: with modified independence;with adaptive equipment;sit to/from stand Pt Will Perform Tub/Shower Transfer: with modified independence;ambulating;shower seat  OT Frequency: Min 2X/week   Barriers to D/C:            Co-evaluation              End of Session Equipment Utilized During Treatment: Gait belt Nurse Communication: Other (comment) (blood at IV site)  Activity Tolerance: Patient tolerated treatment well Patient left: in chair;with call bell/phone within reach;with chair alarm set   Time: [phone] OT Time Calculation (min): 28 min Charges:  OT General Charges $OT Visit: 1 Procedure OT Evaluation $Initial OT Evaluation Tier I: 1 Procedure OT Treatments $Self Care/Home Management : 8-22 mins G-Codes:    Pilar Grammes Nov 24, 2014, 2:26 PM

## 2014-11-19 NOTE — Evaluation (Signed)
Speech Language Pathology Evaluation Patient Details Name: Tami Ramirez MRN: [id number] DOB: [birth year] Today's Date: 11/19/2014 Time: 6295-2841 SLP Time Calculation (min) (ACUTE ONLY): 22 min  Problem List:  Patient Active Problem List   Diagnosis Date Noted  . TIA (transient ischemic attack) 11/18/2014  . Coronary atherosclerosis of native coronary artery 04/21/2013  . Pure hypercholesterolemia 04/21/2013  . Essential hypertension, benign 04/21/2013  . Mitral valve disorder 04/21/2013  . Atrial fibrillation 04/21/2013  . Chronic diastolic heart failure 04/21/2013  . Unspecified late effects of cerebrovascular disease 04/21/2013  . S/P mitral valve repair 10/07/2009  . S/P Maze operation for atrial fibrillation 10/07/2009   Past Medical History:  Past Medical History  Diagnosis Date  . S/P mitral valve repair 10/07/2009  . S/P Maze operation for atrial fibrillation 10/07/2009  . Essential hypertension, benign   . ASCVD (arteriosclerotic cardiovascular disease)     SV,AWMI June 97, PTCA/stent LAD 1997, PTCA diagonal 08/07  . Exertional dyspnea     Chronic  . Overweight   . Chronic diastolic heart failure   . Atrial fibrillation   . Chronic kidney disease   . Osteoporosis   . Equilibrium disorder     Chronic  . Stroke    Past Surgical History:  Past Surgical History  Procedure Laterality Date  . Right miniature thoracotomy for mitral valve repair  10/07/2009    28-mm Sorin Memo 3-D ring annuloplasty  . Cox maze procedure and ligation of left femoral av fistula  10/07/2009    Dr Cornelius Moras  . Total abdominal hysterectomy w/ bilateral salpingoophorectomy    . Appendectomy     HPI:  81 y.o. female with past medical history of coronary artery disease, mitral valve repair, hypertension, hyperlipidemia, diastolic congestive heart failure, chronic kidney disease-stage II, s/ p of Maze surgery 10/07/09, who presents with slurred speech and mildly confused due too. CT negative for  acute abnormalities. Urinalysis showed trace amount of leukocytes. EKG showed occasional PVC and PAC. MRI 1 cm LEFT frontal lobe acute infarct. Moderate to severe atrophy. RIGHT frontal lobe encephalomalacia may reflect remote trauma/hemorrhage with minimal LEFT inferior frontal lobe encephalomalacia. LEFT parietal occipital lobe infarct may be posttraumatic or ischemic. Remote LEFT basal ganglia lacunar infarct. No recent CXR   Assessment / Plan / Recommendation Clinical Impression  Pt exhibiting mild memory impairments for recent information. Scored WFL's on Cognistat 4 word recall subtest. She did not spontaneously recall PT session one hour earlier after SLP checking if PT will work with pt today to determine her balance. After prompts she recalled walking with PT this morning. Verbal problem solving, orientation, awareness appeared functional. Pt has assist from daughter Misty Stanley who fills weekly pill box and manages finances and frequent check in's from grandchildren. She uses stove infrequently and eats out much of the time. Pt wears her phone around her neck at home. Speech is intelligible with detection of minimal distortions with pt awareness. SLP educated re: speech techiques to faciiltate intelligibility in noisy environments as well as education for memory (write info). Do not recommend further ST at this time.     SLP Assessment  Patient does not need any further Speech Lanaguage Pathology Services    Follow Up Recommendations  None    Frequency and Duration        Pertinent Vitals/Pain Pain Assessment: No/denies pain   SLP Goals     SLP Evaluation Prior Functioning  Cognitive/Linguistic Baseline:  (no family present, suspect memory deficits (?)) Type of Home:  House  Lives With: Alone (dtr Misty StanleyLisa very involved, frequent family check in's) Available Help at Discharge: Family   Cognition  Overall Cognitive Status:  (suspect memory deficits ?, no family present) Arousal/Alertness:  Awake/alert Orientation Level: Oriented X4 Attention: Sustained Sustained Attention: Appears intact Memory:  (functional 4 word recall, informal memory difficulty) Awareness: Appears intact Problem Solving:  (verbal functional, min prompts needed) Safety/Judgment: Appears intact    Comprehension  Auditory Comprehension Overall Auditory Comprehension: Appears within functional limits for tasks assessed Visual Recognition/Discrimination Discrimination: Not tested Reading Comprehension Reading Status: Not tested    Expression Expression Primary Mode of Expression: Verbal Verbal Expression Overall Verbal Expression: Appears within functional limits for tasks assessed Pragmatics: No impairment Written Expression Dominant Hand: Right Written Expression: Not tested   Oral / Motor Oral Motor/Sensory Function Overall Oral Motor/Sensory Function: Impaired Labial ROM: Reduced right Labial Symmetry: Abnormal symmetry right Labial Sensation: Within Functional Limits Lingual ROM: Within Functional Limits Lingual Symmetry: Within Functional Limits Lingual Strength: Within Functional Limits Facial ROM: Within Functional Limits Facial Symmetry: Within Functional Limits Facial Strength: Within Functional Limits Mandible: Within Functional Limits Motor Speech Overall Motor Speech: Impaired Respiration: Within functional limits Phonation: Normal Resonance: Within functional limits Articulation: Within functional limitis Intelligibility: Intelligible (min distortions) Motor Planning: Witnin functional limits   GO     Royce MacadamiaLitaker, Simon Llamas Willis 11/19/2014, 10:29 AM  Breck CoonsLisa Willis Lonell FaceLitaker M.Ed [employer]-SLP Pager [phone]

## 2014-11-19 NOTE — Evaluation (Addendum)
Physical Therapy Evaluation Patient Details Name: Tami Ramirez MRN: [id number] DOB: November 02, 1928 Today's Date: 11/19/2014   History of Present Illness  81 y.o. female with past medical history of coronary artery disease (s/p of stent in LAD in 1997), mitral valve repair in March of 2011 due to severe mitral regurgitation, hypertension, hyperlipidemia, diastolic congestive heart failure, PAF not on blood thinner, chronic kidney disease-stage II, s/ p of Maze surgery 10/07/09, who presented with slurred speech (since resolved). Neuro work up underway.  Clinical Impression  Patient demonstrates deficits in functional mobility as indicated below. Will benefit from continued skilled PT to address deficits and maximize function. At this time, patient presents with some instability during mobility (although this may be baseline for [place] but no family at bedside to inquire at this time) I recommend safety assessment with HHPT for in home balance to ensure patient mobilizing without falls. Additionally, I encourage initial supervision upon discharge. Will continue to see as indicated and progress as tolerated.     Follow Up Recommendations Home health PT;Supervision for mobility/OOB (recommend initial 24 hr supervision upon discharge )    Equipment Recommendations  None recommended by PT    Recommendations for Other Services       Precautions / Restrictions Precautions Precautions: Fall      Mobility  Bed Mobility Overal bed mobility: Needs Assistance Bed Mobility: Sit to Supine       Sit to supine: Supervision   General bed mobility comments: No physical assist needed  Transfers Overall transfer level: Needs assistance Equipment used: None Transfers: Sit to/from Stand Sit to Stand: Supervision         General transfer comment: no physical assist, distant supervision secondary to instability. Patient able to perform from chair as well as from  toilet.  Ambulation/Gait Ambulation/Gait assistance: Min guard Ambulation Distance (Feet): 120 Feet Assistive device: None Gait Pattern/deviations: Step-through pattern;Decreased stride length;Staggering left;Staggering right;Drifts right/left;Narrow base of support Gait velocity: decreased Gait velocity interpretation: Below normal speed for age/gender General Gait Details: instability noted with ambulation, patient often reaching out for furniture or railings for stablity, states that she does this at home as well  Stairs            Wheelchair Mobility    Modified Rankin (Stroke Patients Only)       Balance Overall balance assessment: Needs assistance   Sitting balance-Leahy Scale: Good     Standing balance support: No upper extremity supported;During functional activity Standing balance-Leahy Scale: Fair Standing balance comment: patient with balance checks noted when crossing threshold of bathroom and standing on inclined surface.                             Pertinent Vitals/Pain Pain Assessment: No/denies pain    Home Living Family/patient expects to be discharged to:: Private residence Living Arrangements: Alone Available Help at Discharge: Family Type of Home: House Home Access: Stairs to enter Entrance Stairs-Rails: Can reach both Entrance Stairs-Number of Steps: 4 Home Layout: One level Home Equipment: Walker - 2 wheels;Cane - single point Additional Comments: wears glasses    Prior Function Level of Independence: Independent               Hand Dominance   Dominant Hand: Right    Extremity/Trunk Assessment   Upper Extremity Assessment: Defer to OT evaluation           Lower Extremity Assessment: Generalized weakness (increased LE edema bilaterally)  Communication   Communication: No difficulties  Cognition Arousal/Alertness: Awake/alert Behavior During Therapy: WFL for tasks assessed/performed Overall  Cognitive Status: No family/caregiver present to determine baseline cognitive functioning                      General Comments      Exercises        Assessment/Plan    PT Assessment Patient needs continued PT services  PT Diagnosis Abnormality of gait;Generalized weakness   PT Problem List Decreased strength;Decreased activity tolerance;Decreased balance;Decreased mobility;Decreased coordination  PT Treatment Interventions DME instruction;Gait training;Stair training;Functional mobility training;Therapeutic activities;Therapeutic exercise;Balance training;Patient/family education   PT Goals (Current goals can be found in the Care Plan section) Acute Rehab PT Goals Patient Stated Goal: to go home PT Goal Formulation: With patient Time For Goal Achievement: 12/03/14 Potential to Achieve Goals: Good    Frequency Min 3X/week   Barriers to discharge        Co-evaluation               End of Session Equipment Utilized During Treatment: Gait belt Activity Tolerance: Patient tolerated treatment well Patient left: in bed;with call bell/phone within reach;with bed alarm set Nurse Communication: Mobility status         Time: 8119-1478 PT Time Calculation (min) (ACUTE ONLY): 24 min   Charges:   PT Evaluation $Initial PT Evaluation Tier I: 1 Procedure PT Treatments $Therapeutic Activity: 8-22 mins   PT G CodesFabio Asa 12-05-2014, 9:20 AM Charlotte Crumb, PT DPT  [phone]

## 2014-11-20 ENCOUNTER — Telehealth: Payer: Self-pay | Admitting: Cardiology

## 2014-11-20 NOTE — Telephone Encounter (Signed)
Spoke with pt daughter informed do not see a DPR on file to discuss pt information in detail.  She stated she is pt healthcare POA and she completed paperwork years ago with our office.  She stated her mother is to see Dr. Anne FuSkains within a week according to d/c summary and does not want to see APP.  Pt offered to try and schedule appt with APP on a day when Dr. Anne FuSkains is here.  She stated she we are just wasting her time and she would setup appointment with PCP first with decided when she wants to have an appointment with our office.

## 2014-11-20 NOTE — Telephone Encounter (Signed)
New Message  Pt daughter calling to make EPH per AVS. Pt was offered next avail Skains appt (June 2) and next avail APP appt, but refused. Pt wanted to speak w/ Rn about an appt w/ Dr. Anne FuSkains in 1 week. Pt was d/c from [facility] on 4/20. Please call back and discuss.

## 2015-01-01 ENCOUNTER — Encounter: Payer: Self-pay | Admitting: Cardiology

## 2015-01-01 ENCOUNTER — Ambulatory Visit (INDEPENDENT_AMBULATORY_CARE_PROVIDER_SITE_OTHER): Payer: Medicare Other | Admitting: Cardiology

## 2015-01-01 VITALS — BP 114/60 | HR 90 | Ht <= 58 in | Wt 134.0 lb

## 2015-01-01 DIAGNOSIS — I480 Paroxysmal atrial fibrillation: Secondary | ICD-10-CM | POA: Diagnosis not present

## 2015-01-01 DIAGNOSIS — I5032 Chronic diastolic (congestive) heart failure: Secondary | ICD-10-CM | POA: Diagnosis not present

## 2015-01-01 DIAGNOSIS — Z8679 Personal history of other diseases of the circulatory system: Secondary | ICD-10-CM

## 2015-01-01 DIAGNOSIS — Z9889 Other specified postprocedural states: Secondary | ICD-10-CM | POA: Diagnosis not present

## 2015-01-01 DIAGNOSIS — I2510 Atherosclerotic heart disease of native coronary artery without angina pectoris: Secondary | ICD-10-CM

## 2015-01-01 NOTE — Progress Notes (Signed)
Patient ID: Tami Ramirez, female   DOB: 14-Apr-1929, 81 y.o.   MRN: [id number]      [address], [address] [place], [place]  [address] Phone: [phone] Fax:  [phone]  Date:  01/01/2015   ID:  Tami, Ramirez 1928/10/08, MRN [id number]  PCP:  Lillia Mountain, MD   History of Present Illness: [name] is a 81 y.o. female coronary artery disease prior stent in LAD in 1997, PTCA of diagonal following anterior wall myocardial infarction, mitral valve repair in March of 2011 due to severe mitral regurgitation.  She is here today after a hospitalization in April, TIA, atrial fibrillation, Ellik was start. She has seen Dr. Valentina Lucks posthospitalization. I agree with anticoagulation.  She continues not to drive. Her speech impairment has resolved. There was some concern previously of UTI causing delirium.    Also has hypertension, hyperlipidemia.  She has had issues in the past with chronic diastolic heart failure. Previous creatinine 0.85. Feels OK. No CP, no significant SOB. No bleeding.     Wt Readings from Last 3 Encounters:  01/01/15 134 lb (60.782 kg)  11/18/14 136 lb 7.4 oz (61.9 kg)  02/05/14 147 lb (66.679 kg)     Past Medical History  Diagnosis Date  . S/P mitral valve repair 10/07/2009  . S/P Maze operation for atrial fibrillation 10/07/2009  . Essential hypertension, benign   . ASCVD (arteriosclerotic cardiovascular disease)     SV,AWMI June 97, PTCA/stent LAD 1997, PTCA diagonal 08/07  . Exertional dyspnea     Chronic  . Overweight   . Chronic diastolic heart failure   . Atrial fibrillation   . Chronic kidney disease   . Osteoporosis   . Equilibrium disorder     Chronic  . Stroke     Past Surgical History  Procedure Laterality Date  . Right miniature thoracotomy for mitral valve repair  10/07/2009    28-mm Sorin Memo 3-D ring annuloplasty  . Cox maze procedure and ligation of left femoral av fistula  10/07/2009    Dr Cornelius Moras  . Total  abdominal hysterectomy w/ bilateral salpingoophorectomy    . Appendectomy      Current Outpatient Prescriptions  Medication Sig Dispense Refill  . acetaminophen (TYLENOL) 325 MG tablet Take 650 mg by mouth every 6 (six) hours as needed for mild pain.     Marland Kitchen amLODipine (NORVASC) 10 MG tablet TAKE 1 TABLET EVERY DAY 30 tablet 11  . apixaban (ELIQUIS) 5 MG TABS tablet Take 1 tablet (5 mg total) by mouth 2 (two) times daily. 120 tablet 0  . Calcium-Phosphorus-Vitamin D (CITRACAL +D3 PO) Take 1 tablet by mouth daily.    . furosemide (LASIX) 40 MG tablet Take 1 tablet (40 mg total) by mouth 2 (two) times daily. (Patient taking differently: Take 20 mg by mouth daily. ) 60 tablet 11  . losartan (COZAAR) 100 MG tablet TAKE 1 TABLET EVERY DAY 30 tablet 11  . Multiple Vitamin (MULTIVITAMIN) capsule Take 1 capsule by mouth daily.    . nitroGLYCERIN (NITROSTAT) 0.4 MG SL tablet Place 1 tablet (0.4 mg total) under the tongue every 5 (five) minutes as needed. 25 tablet prn  . potassium chloride SA (K-DUR,KLOR-CON) 20 MEQ tablet Take 20 mEq by mouth once.   1  . pravastatin (PRAVACHOL) 40 MG tablet TAKE ONE TABLET BY MOUTH EVERY DAY 30 tablet 11   No current facility-administered medications for this visit.    Allergies:  Allergies  Allergen Reactions  . Codeine Nausea Only  . Darvocet [Propoxyphene N-Acetaminophen] Other (See Comments)    Spaced out  . Lisinopril Cough  . Morphine Sulfate Other (See Comments)    Low heart rate   . Spironolactone Other (See Comments)    hyperkalemia  . Penicillins Rash    Social History:  The patient  reports that she has never smoked. She has never used smokeless tobacco. She reports that she does not drink alcohol or use illicit drugs.   ROS:  Please see the history of present illness.   Denies any bleeding, syncope, orthopnea, PND All other systems reviewed and negative.   PHYSICAL EXAM: VS:  BP 114/60 mmHg  Pulse 90  Ht 4' 9.5" (1.461 m)  Wt 134 lb  (60.782 kg)  BMI 28.48 kg/m2  SpO2 97% Well nourished, well developed, in no acute distress HEENT: normal Neck: no JVD Cardiac:  normal S1, S2; mildly tachycardic, ectopy, slightly irregular; no murmur Lungs:  clear to auscultation bilaterally, no wheezing, rhonchi or rales Abd: soft, nontender, no hepatomegaly Ext: no edema Skin: warm and dry Neuro: no focal abnormalities noted  EKG:   02/05/14-sinus tachycardia rate 109 with occasional PACs, nonspecific ST-T wave changes, inversion in lateral precordial leads, prior EKG showed Sinus rhythm, 96, occasional PACs, PVCs. Nonspecific T-wave flattening.     ASSESSMENT AND PLAN:  81 year old with paroxysmal atrial fibrillation status post mitral valve repair, maze procedure, CAD, hypertension, hyperlipidemia.  1. Stroke - dysphagia. No driving. 2. Atrial fibrillation-paroxysmal, currently in sinus rhythm/tachycardia. TIA during April 2016 hospitalization. Started on Eliquis 5 mg twice a day. Normal creatinine. No further evidence of atrial fibrillation at this time. CHADS-Vasc 6 3. PACs-seen on EKG. Asymptomatic. 4. Chronic anticoagulation-agree with Ellik was 5 mg twice a day. No evidence of bleeding currently. Expressed or other concerns or risks of bleeding. 5. Status post mitral valve repair. Doing well. Dental antibiotics. EF 50% 6. Hypertension-under good control. No changes made. 7.  We will see her back in 4 months.  Signed, Donato SchultzMark Hailie Searight, MD [facility]  01/01/2015 9:41 AM

## 2015-01-01 NOTE — Patient Instructions (Signed)
Medication Instructions:  Your physician recommends that you continue on your current medications as directed. Please refer to the Current Medication list given to you today.  Follow-Up: Follow up in 4 months with Dr. Skains.  You will receive a letter in the mail 2 months before you are due.  Please call us when you receive this letter to schedule your follow up appointment.  Thank you for choosing [facility]!!       

## 2015-02-10 ENCOUNTER — Other Ambulatory Visit: Payer: Self-pay | Admitting: Cardiology

## 2015-02-10 ENCOUNTER — Other Ambulatory Visit: Payer: Self-pay

## 2015-02-10 MED ORDER — AMLODIPINE BESYLATE 10 MG PO TABS: ORAL_TABLET | ORAL | Status: DC

## 2015-02-10 MED ORDER — LOSARTAN POTASSIUM 100 MG PO TABS: ORAL_TABLET | ORAL | Status: DC

## 2015-02-10 MED ORDER — PRAVASTATIN SODIUM 40 MG PO TABS: ORAL_TABLET | ORAL | Status: DC

## 2015-02-11 ENCOUNTER — Telehealth: Payer: Self-pay | Admitting: *Deleted

## 2015-02-11 NOTE — Telephone Encounter (Signed)
[facility] requests eliquis 5mg  refill for patient. She was started on this in the hospital by Dr Jeoffrey MassedShanker Ghimire who is also the provider on the rx request. Ok to refill? Please advise. Thanks, MI

## 2015-02-12 ENCOUNTER — Other Ambulatory Visit: Payer: Self-pay | Admitting: *Deleted

## 2015-02-12 MED ORDER — APIXABAN 5 MG PO TABS: 5.0000 mg | ORAL_TABLET | Freq: Two times a day (BID) | ORAL | Status: DC

## 2015-02-12 NOTE — Telephone Encounter (Signed)
Agree. SKAINS, MARK, MD  

## 2015-02-12 NOTE — Telephone Encounter (Signed)
OK to refill however the patient does need to schedule her 4 month follow up appt.

## 2015-03-25 ENCOUNTER — Other Ambulatory Visit: Payer: Self-pay

## 2015-03-25 ENCOUNTER — Other Ambulatory Visit: Payer: Self-pay | Admitting: Cardiology

## 2015-03-25 MED ORDER — FUROSEMIDE 40 MG PO TABS: 40.0000 mg | ORAL_TABLET | Freq: Two times a day (BID) | ORAL | Status: DC

## 2015-05-20 ENCOUNTER — Ambulatory Visit: Payer: Medicare Other | Admitting: Cardiology

## 2015-05-22 ENCOUNTER — Encounter: Payer: Self-pay | Admitting: Cardiology

## 2015-05-22 ENCOUNTER — Ambulatory Visit (INDEPENDENT_AMBULATORY_CARE_PROVIDER_SITE_OTHER): Payer: Medicare Other | Admitting: Cardiology

## 2015-05-22 VITALS — BP 140/78 | HR 87 | Ht 60.0 in | Wt 136.8 lb

## 2015-05-22 DIAGNOSIS — Z8679 Personal history of other diseases of the circulatory system: Secondary | ICD-10-CM

## 2015-05-22 DIAGNOSIS — Z9889 Other specified postprocedural states: Secondary | ICD-10-CM

## 2015-05-22 DIAGNOSIS — I5032 Chronic diastolic (congestive) heart failure: Secondary | ICD-10-CM | POA: Diagnosis not present

## 2015-05-22 DIAGNOSIS — I2510 Atherosclerotic heart disease of native coronary artery without angina pectoris: Secondary | ICD-10-CM

## 2015-05-22 DIAGNOSIS — I4891 Unspecified atrial fibrillation: Secondary | ICD-10-CM

## 2015-05-22 LAB — BASIC METABOLIC PANEL
BUN: 10 mg/dL (ref 7–25)
CHLORIDE: 109 mmol/L (ref 98–110)
CO2: 24 mmol/L (ref 20–31)
Calcium: 8.7 mg/dL (ref 8.6–10.4)
Creat: 0.71 mg/dL (ref 0.60–0.88)
Glucose, Bld: 91 mg/dL (ref 65–99)
POTASSIUM: 3 mmol/L — AB (ref 3.5–5.3)
Sodium: 144 mmol/L (ref 135–146)

## 2015-05-22 NOTE — Progress Notes (Signed)
Patient ID: Tami Ramirez, female   DOB: 1928/12/28, 81 y.o.   MRN: [id number]      [address] [place], [place]  [id number] Phone: [phone] Fax:  [phone]  Date:  05/22/2015   ID:  Victorino DikeLouise W Cottier, DOB 1928/12/28, MRN [id number]  PCP:  Lillia MountainGRIFFIN,JOHN JOSEPH, MD   History of Present Illness: Tami RochLouise W Graw is a 81 y.o. female coronary artery disease prior stent in LAD in 1997, PTCA of diagonal following anterior wall myocardial infarction, mitral valve repair in March of 2011 due to severe mitral regurgitation.  She is here today after a hospitalization in April, TIA, atrial fibrillation, Eliquis start. She has seen Dr. Valentina LucksGriffin posthospitalization. I agree with anticoagulation.  She continues not to drive. Her speech impairment has resolved. There was some concern previously of UTI causing delirium.    Also has hypertension, hyperlipidemia.  She has had issues in the past with chronic diastolic heart failure. Previous creatinine 0.85. Feels OK. No CP, no significant SOB. No bleeding.     Wt Readings from Last 3 Encounters:  05/22/15 136 lb 12.8 oz (62.052 kg)  01/01/15 134 lb (60.782 kg)  11/18/14 136 lb 7.4 oz (61.9 kg)     Past Medical History  Diagnosis Date  . S/P mitral valve repair 10/07/2009  . S/P Maze operation for atrial fibrillation 10/07/2009  . Essential hypertension, benign   . ASCVD (arteriosclerotic cardiovascular disease)     SV,AWMI June 97, PTCA/stent LAD 1997, PTCA diagonal 08/07  . Exertional dyspnea     Chronic  . Overweight   . Chronic diastolic heart failure (HCC)   . Atrial fibrillation (HCC)   . Chronic kidney disease   . Osteoporosis   . Equilibrium disorder     Chronic  . Stroke [facility](HCC)     Past Surgical History  Procedure Laterality Date  . Right miniature thoracotomy for mitral valve repair  10/07/2009    28-mm Sorin Memo 3-D ring annuloplasty  . Cox maze procedure and ligation of left femoral av fistula  10/07/2009    Dr Cornelius Moraswen  . Total abdominal hysterectomy w/ bilateral salpingoophorectomy    . Appendectomy      Current Outpatient Prescriptions  Medication Sig Dispense Refill  . acetaminophen (TYLENOL) 325 MG tablet Take 650 mg by mouth every 6 (six) hours as needed for mild pain.     Marland Kitchen. amLODipine (NORVASC) 10 MG tablet TAKE 1 TABLET EVERY DAY 30 tablet 11  . apixaban (ELIQUIS) 5 MG TABS tablet Take 1 tablet (5 mg total) by mouth 2 (two) times daily. 60 tablet 2  . Calcium-Phosphorus-Vitamin D (CITRACAL +D3 PO) Take 1 tablet by mouth daily.    . furosemide (LASIX) 40 MG tablet Take 1 tablet (40 mg total) by mouth 2 (two) times daily. 60 tablet 11  . losartan (COZAAR) 100 MG tablet TAKE 1 TABLET EVERY DAY 30 tablet 11  . Multiple Vitamin (MULTIVITAMIN) capsule Take 1 capsule by mouth daily.    . nitroGLYCERIN (NITROSTAT) 0.4 MG SL tablet Place 1 tablet (0.4 mg total) under the tongue every 5 (five) minutes as needed. 25 tablet prn  . pravastatin (PRAVACHOL) 40 MG tablet TAKE ONE TABLET BY MOUTH EVERY DAY 30 tablet 11  . potassium chloride SA (K-DUR,KLOR-CON) 20 MEQ tablet Take 20 mEq by mouth once.   1   No current facility-administered medications for this visit.    Allergies:    Allergies  Allergen Reactions  . Codeine  Nausea Only  . Darvocet [Propoxyphene N-Acetaminophen] Other (See Comments)    Spaced out  . Lisinopril Cough  . Morphine Sulfate Other (See Comments)    Low heart rate   . Spironolactone Other (See Comments)    hyperkalemia  . Penicillins Rash    Social History:  The patient  reports that she has never smoked. She has never used smokeless tobacco. She reports that she does not drink alcohol or use illicit drugs.   ROS:  Please see the history of present illness.   Denies any bleeding, syncope, orthopnea, PND All other systems reviewed and negative.   PHYSICAL EXAM: VS:  BP 140/78 mmHg  Pulse 87  Ht 5' (1.524 m)  Wt 136 lb 12.8 oz (62.052 kg)  BMI 26.72 kg/m2  SpO2  90% Well nourished, well developed, in no acute distress HEENT: normal Neck: no JVD Cardiac:  normal S1, S2; mildly tachycardic, ectopy, slightly irregular; no murmur Lungs:  clear to auscultation bilaterally, no wheezing, rhonchi or rales Abd: soft, nontender, no hepatomegaly Ext: no edema Skin: warm and dry Neuro: no focal abnormalities noted  EKG:   02/05/14-sinus tachycardia rate 109 with occasional PACs, nonspecific ST-T wave changes, inversion in lateral precordial leads, prior EKG showed Sinus rhythm, 96, occasional PACs, PVCs. Nonspecific T-wave flattening.     ASSESSMENT AND PLAN:  81 year old with paroxysmal atrial fibrillation status post mitral valve repair, maze procedure, CAD, hypertension, hyperlipidemia.  1. Stroke 4/16- dysphagia improved. No driving. Has a walker now. Doing better. Help at home. 2. Atrial fibrillation-paroxysmal,  Rhythm appears fairly regular currently. TIA during April 2016 hospitalization. Started on Eliquis 5 mg twice a day. Normal creatinine. No further evidence of atrial fibrillation at this time. CHADS-Vasc 6 , checking blood work today. 3. PACs-seen on EKG. Asymptomatic. 4. Chronic anticoagulation-agree with Eliquis 5 mg twice a day. No evidence of bleeding currently. Expressed or other concerns or risks of bleeding. 5. Status post mitral valve repair. Doing well. Dental antibiotics. EF 50% 6. Hypertension-under good control. No changes made. With furosemide, checking basic metabolic profile. She has been out of her potassium for about a month and half. We will see where her potassium levels are before restarting. 7.  CAD-stable, no anginal symptoms. 8.  Chronic diastolic heart failure-continuing with furosemide. 9.  We will see her back in 6 months.  Signed, Donato Schultz, MD [facility]  05/22/2015 9:04 AM

## 2015-05-22 NOTE — Patient Instructions (Signed)
Medication Instructions:   Your physician recommends that you continue on your current medications as directed. Please refer to the Current Medication list given to you today.    Labwork:  CBC AND BMET TODAY   Testing/Procedures: NONE ORDER TODAY    Follow-Up:  Your physician wants you to follow-up in:  IN  6  MONTHS WITH DR Anne FuSKAINS  You will receive a reminder letter in the mail two months in advance. If you don't receive a letter, please call our office to schedule the follow-up appointment.  Any Other Special Instructions Will Be Listed Below (If Applicable).

## 2015-05-26 ENCOUNTER — Other Ambulatory Visit: Payer: Self-pay | Admitting: *Deleted

## 2015-05-26 DIAGNOSIS — E876 Hypokalemia: Secondary | ICD-10-CM

## 2015-05-26 DIAGNOSIS — Z79899 Other long term (current) drug therapy: Secondary | ICD-10-CM

## 2015-06-23 ENCOUNTER — Other Ambulatory Visit: Payer: Medicare Other

## 2015-06-29 ENCOUNTER — Other Ambulatory Visit: Payer: Self-pay

## 2015-06-29 MED ORDER — APIXABAN 5 MG PO TABS: 5.0000 mg | ORAL_TABLET | Freq: Two times a day (BID) | ORAL | Status: DC

## 2015-08-17 ENCOUNTER — Other Ambulatory Visit: Payer: Self-pay | Admitting: *Deleted

## 2015-08-17 MED ORDER — LOSARTAN POTASSIUM 100 MG PO TABS: ORAL_TABLET | ORAL | Status: DC

## 2015-08-17 MED ORDER — FUROSEMIDE 40 MG PO TABS: 40.0000 mg | ORAL_TABLET | Freq: Two times a day (BID) | ORAL | Status: DC

## 2015-08-18 ENCOUNTER — Other Ambulatory Visit: Payer: Self-pay

## 2015-08-18 MED ORDER — APIXABAN 5 MG PO TABS: 5.0000 mg | ORAL_TABLET | Freq: Two times a day (BID) | ORAL | Status: DC

## 2015-08-18 MED ORDER — AMLODIPINE BESYLATE 10 MG PO TABS: ORAL_TABLET | ORAL | Status: DC

## 2015-08-18 MED ORDER — PRAVASTATIN SODIUM 40 MG PO TABS: ORAL_TABLET | ORAL | Status: DC

## 2015-08-19 ENCOUNTER — Other Ambulatory Visit: Payer: Self-pay | Admitting: Cardiology

## 2015-08-19 MED ORDER — LOSARTAN POTASSIUM 100 MG PO TABS: ORAL_TABLET | ORAL | Status: DC

## 2015-08-19 MED ORDER — FUROSEMIDE 40 MG PO TABS: 40.0000 mg | ORAL_TABLET | Freq: Two times a day (BID) | ORAL | Status: DC

## 2015-08-19 NOTE — Telephone Encounter (Signed)
Rx was sent to the wrong pharmacy. Resent Rx to [facility] pharmacy. Confirmation received.

## 2015-09-27 ENCOUNTER — Ambulatory Visit (INDEPENDENT_AMBULATORY_CARE_PROVIDER_SITE_OTHER): Payer: Commercial Managed Care - HMO | Admitting: Family Medicine

## 2015-09-27 VITALS — BP 134/64 | HR 93 | Temp 98.2°F | Resp 14 | Ht <= 58 in | Wt 133.0 lb

## 2015-09-27 DIAGNOSIS — J209 Acute bronchitis, unspecified: Secondary | ICD-10-CM

## 2015-09-27 DIAGNOSIS — N3000 Acute cystitis without hematuria: Secondary | ICD-10-CM | POA: Diagnosis not present

## 2015-09-27 DIAGNOSIS — R110 Nausea: Secondary | ICD-10-CM

## 2015-09-27 DIAGNOSIS — R42 Dizziness and giddiness: Secondary | ICD-10-CM

## 2015-09-27 LAB — POCT URINALYSIS DIP (MANUAL ENTRY)
BILIRUBIN UA: NEGATIVE
BILIRUBIN UA: NEGATIVE
GLUCOSE UA: NEGATIVE
NITRITE UA: POSITIVE — AB
PH UA: 6.5
Protein Ur, POC: 30 — AB
Spec Grav, UA: 1.02
Urobilinogen, UA: 0.2

## 2015-09-27 LAB — POC MICROSCOPIC URINALYSIS (UMFC): Mucus: ABSENT

## 2015-09-27 MED ORDER — ALBUTEROL SULFATE (2.5 MG/3ML) 0.083% IN NEBU: 2.5000 mg | INHALATION_SOLUTION | Freq: Four times a day (QID) | RESPIRATORY_TRACT | Status: AC | PRN

## 2015-09-27 MED ORDER — IPRATROPIUM BROMIDE 0.02 % IN SOLN
0.5000 mg | Freq: Once | RESPIRATORY_TRACT | Status: AC
  Administered 2015-09-27: 0.5 mg via RESPIRATORY_TRACT

## 2015-09-27 MED ORDER — ONDANSETRON 4 MG PO TBDP
4.0000 mg | ORAL_TABLET | Freq: Once | ORAL | Status: AC
  Administered 2015-09-27: 4 mg via ORAL

## 2015-09-27 MED ORDER — LEVOFLOXACIN 500 MG PO TABS: 500.0000 mg | ORAL_TABLET | Freq: Every day | ORAL | Status: DC

## 2015-09-27 MED ORDER — ALBUTEROL SULFATE (2.5 MG/3ML) 0.083% IN NEBU
2.5000 mg | INHALATION_SOLUTION | Freq: Once | RESPIRATORY_TRACT | Status: AC
  Administered 2015-09-27: 2.5 mg via RESPIRATORY_TRACT

## 2015-09-27 MED ORDER — AEROCHAMBER PLUS MISC: Status: AC

## 2015-09-27 MED ORDER — ALBUTEROL SULFATE 108 (90 BASE) MCG/ACT IN AEPB: 2.0000 | INHALATION_SPRAY | RESPIRATORY_TRACT | Status: AC | PRN

## 2015-09-27 MED ORDER — ONDANSETRON 4 MG PO TBDP: 4.0000 mg | ORAL_TABLET | Freq: Three times a day (TID) | ORAL | Status: AC | PRN

## 2015-09-27 NOTE — Progress Notes (Addendum)
Subjective:    Patient ID: Tami Ramirez, female    DOB: 02/10/1929, 82 y.o.   MRN: [id number] Chief Complaint  Patient presents with  . Cough    4 days   . Nausea  . Urinary Tract Infection    HPI  Began to feel ill on Thursday with cough. Was dry, with new DOE and [facility] for the past few days.   Nausea just started this a.m  A lot of fatigue and decreased appetite for the past 5d but that has been poor for a while.  Has taken tylenol but tries to stay away from otc meds due to medical comorbidities.  She had a UTI as well in the past so wanted to check today. Did get flu shot this yr.  No swelling in leg, no change in weight. No change in sleep position or orthopenia from baseline.  Past Medical History  Diagnosis Date  . S/P mitral valve repair 10/07/2009  . S/P Maze operation for atrial fibrillation 10/07/2009  . Essential hypertension, benign   . ASCVD (arteriosclerotic cardiovascular disease)     SV,AWMI June 97, PTCA/stent LAD 1997, PTCA diagonal 08/07  . Exertional dyspnea     Chronic  . Overweight   . Chronic diastolic heart failure (HCC)   . Atrial fibrillation (HCC)   . Chronic kidney disease   . Osteoporosis   . Equilibrium disorder     Chronic  . Stroke [facility])    Past Surgical History  Procedure Laterality Date  . Right miniature thoracotomy for mitral valve repair  10/07/2009    28-mm Sorin Memo 3-D ring annuloplasty  . Cox maze procedure and ligation of left femoral av fistula  10/07/2009    Dr Cornelius Moras  . Total abdominal hysterectomy w/ bilateral salpingoophorectomy    . Appendectomy     Current Outpatient Prescriptions on File Prior to Visit  Medication Sig Dispense Refill  . acetaminophen (TYLENOL) 325 MG tablet Take 650 mg by mouth every 6 (six) hours as needed for mild pain.     Marland Kitchen amLODipine (NORVASC) 10 MG tablet TAKE 1 TABLET EVERY DAY 90 tablet 1  . apixaban (ELIQUIS) 5 MG TABS tablet Take 1 tablet (5 mg total) by mouth 2 (two) times daily. 180 tablet  1  . Calcium-Phosphorus-Vitamin D (CITRACAL +D3 PO) Take 1 tablet by mouth daily.    . furosemide (LASIX) 40 MG tablet Take 1 tablet (40 mg total) by mouth 2 (two) times daily. 180 tablet 2  . losartan (COZAAR) 100 MG tablet TAKE 1 TABLET EVERY DAY 90 tablet 2  . Multiple Vitamin (MULTIVITAMIN) capsule Take 1 capsule by mouth daily.    . nitroGLYCERIN (NITROSTAT) 0.4 MG SL tablet Place 1 tablet (0.4 mg total) under the tongue every 5 (five) minutes as needed. 25 tablet prn  . potassium chloride SA (K-DUR,KLOR-CON) 20 MEQ tablet Take 20 mEq by mouth once.   1  . pravastatin (PRAVACHOL) 40 MG tablet TAKE ONE TABLET BY MOUTH EVERY DAY 90 tablet 1   No current facility-administered medications on file prior to visit.   Allergies  Allergen Reactions  . Codeine Nausea Only  . Darvocet [Propoxyphene N-Acetaminophen] Other (See Comments)    Spaced out  . Lisinopril Cough  . Morphine Sulfate Other (See Comments)    Low heart rate   . Spironolactone Other (See Comments)    hyperkalemia  . Penicillins Rash   Family History  Problem Relation Age of Onset  . Stroke Mother   .  Stroke Father   . Bone cancer Brother    Social History   Social History  . Marital Status: Widowed    Spouse Name: N/A  . Number of Children: N/A  . Years of Education: N/A   Social History Main Topics  . Smoking status: Never Smoker   . Smokeless tobacco: Never Used  . Alcohol Use: No  . Drug Use: No  . Sexual Activity: Not Asked   Other Topics Concern  . None   Social History Narrative     Review of Systems  Constitutional: Positive for fever, activity change, appetite change and fatigue. Negative for chills, diaphoresis and unexpected weight change.  Respiratory: Positive for cough, chest tightness and shortness of breath.   Cardiovascular: Negative for leg swelling.  Gastrointestinal: Positive for nausea. Negative for vomiting, abdominal pain and diarrhea.  Genitourinary: Positive for pelvic pain.  Negative for flank pain.  Neurological: Positive for dizziness and light-headedness.  Psychiatric/Behavioral: Positive for sleep disturbance.       Objective:  BP 134/64 mmHg  Pulse 93  Temp(Src) 98.2 F (36.8 C) (Oral)  Resp 14  Ht 4\' 9"  (1.448 m)  Wt 133 lb (60.328 kg)  BMI 28.77 kg/m2  SpO2 97%  Physical Exam  Constitutional: She is oriented to person, place, and time. She appears well-developed and well-nourished. No distress.  HENT:  Head: Normocephalic and atraumatic.  Cardiovascular: Normal rate, regular rhythm, normal heart sounds and intact distal pulses.   Pulmonary/Chest: Effort normal and breath sounds normal.  Abdominal: Soft. Bowel sounds are normal. She exhibits no distension. There is no hepatosplenomegaly. There is no tenderness. There is no rebound, no guarding and no CVA tenderness.  Neurological: She is alert and oriented to person, place, and time.  Skin: Skin is warm and dry. She is not diaphoretic.  Psychiatric: She has a normal mood and affect. Her behavior is normal.      Results for orders placed or performed in visit on 09/27/15  Urine culture  Result Value Ref Range   Colony Count >=100,000 COLONIES/ML    Preliminary Report ESCHERICHIA COLI   POCT urinalysis dipstick  Result Value Ref Range   Color, UA yellow yellow   Clarity, UA clear clear   Glucose, UA negative negative   Bilirubin, UA negative negative   Ketones, POC UA negative negative   Spec Grav, UA 1.020    Blood, UA trace-intact (A) negative   pH, UA 6.5    Protein Ur, POC =30 (A) negative   Urobilinogen, UA 0.2    Nitrite, UA Positive (A) Negative   Leukocytes, UA small (1+) (A) Negative  POCT Microscopic Urinalysis ([facility])  Result Value Ref Range   WBC,UR,HPF,POC Few (A) None WBC/hpf   RBC,UR,HPF,POC None None RBC/hpf   Bacteria Many (A) None, Too numerous to count   Mucus Absent Absent   Epithelial Cells, UR Per Microscopy Moderate (A) None, Too numerous to count cells/hpf      Assessment & Plan:   1. Nausea without vomiting   2. Vertigo   3. Acute bronchitis, unspecified organism   4. Acute cystitis without hematuria   Vertigo and nausea both improved after a trial of odt zofran 4mg  in office.  Start levaquin since good cvg for lower resp tract sxs and GU sxs.  RTC in 3d if not sig improved, immed if worse   Orders Placed This Encounter  Procedures  . Urine culture  . POCT urinalysis dipstick  . POCT Microscopic Urinalysis ([facility])  Meds ordered this encounter  Medications  . albuterol (PROVENTIL) (2.5 MG/3ML) 0.083% nebulizer solution 2.5 mg    Sig:   . ipratropium (ATROVENT) nebulizer solution 0.5 mg    Sig:   . ondansetron (ZOFRAN-ODT) disintegrating tablet 4 mg    Sig:   . levofloxacin (LEVAQUIN) 500 MG tablet    Sig: Take 1 tablet (500 mg total) by mouth daily.    Dispense:  7 tablet    Refill:  0  . ondansetron (ZOFRAN ODT) 4 MG disintegrating tablet    Sig: Take 1 tablet (4 mg total) by mouth every 8 (eight) hours as needed for nausea or vomiting.    Dispense:  20 tablet    Refill:  0  . Albuterol Sulfate (PROAIR RESPICLICK) 108 (90 Base) MCG/ACT AEPB    Sig: Inhale 2 puffs into the lungs every 4 (four) hours as needed.    Dispense:  1 each    Refill:  0    Ok to dispense any albuterol inhaler that is best with pt's insurance. Please demonstrate use to pt.  Marland Kitchen Spacer/Aero-Holding Chambers (AEROCHAMBER PLUS) inhaler    Sig: Use as instructed    Dispense:  1 each    Refill:  0    Please dispense a spacer that works with the albuterol inh pt is being prescribed today. Ok to dispense any albuterol inhaler that is best with pt's insurance. Please demonstrate use to pt.  . albuterol (PROVENTIL) (2.5 MG/3ML) 0.083% nebulizer solution    Sig: Take 3 mLs (2.5 mg total) by nebulization every 6 (six) hours as needed for wheezing or shortness of breath.    Dispense:  150 mL    Refill:  0    Norberto Sorenson, MD MPH  ADDENDUM: after 4d, urine  culture shows E. Coli UTI that is resistent to amox, augementin, 1st gen ceph, cipro, and levaquin. Stop levaquin and start bactrim bid x 10d.

## 2015-09-29 ENCOUNTER — Other Ambulatory Visit: Payer: Self-pay | Admitting: Nurse Practitioner

## 2015-09-29 ENCOUNTER — Ambulatory Visit
Admission: RE | Admit: 2015-09-29 | Discharge: 2015-09-29 | Disposition: A | Discharge status: Home / Self Care | Payer: Commercial Managed Care - HMO | Source: Ambulatory Visit | Attending: Nurse Practitioner | Admitting: Nurse Practitioner

## 2015-09-29 DIAGNOSIS — J40 Bronchitis, not specified as acute or chronic: Secondary | ICD-10-CM

## 2015-09-29 LAB — URINE CULTURE: Colony Count: >=100000

## 2015-10-01 MED ORDER — SULFAMETHOXAZOLE-TRIMETHOPRIM 800-160 MG PO TABS: 1.0000 | ORAL_TABLET | Freq: Two times a day (BID) | ORAL | Status: DC

## 2015-10-01 NOTE — Addendum Note (Signed)
Addended by: Norberto Sorenson on: 10/01/2015 07:17 PM   Modules accepted: Orders, Medications, SmartSet

## 2015-10-25 ENCOUNTER — Emergency Department (HOSPITAL_COMMUNITY)
Admission: EM | Admit: 2015-10-25 | Discharge: 2015-10-25 | Disposition: A | Discharge status: Home / Self Care | Payer: Commercial Managed Care - HMO | Attending: Emergency Medicine | Admitting: Emergency Medicine

## 2015-10-25 ENCOUNTER — Emergency Department (HOSPITAL_COMMUNITY): Payer: Commercial Managed Care - HMO

## 2015-10-25 ENCOUNTER — Encounter (HOSPITAL_COMMUNITY): Payer: Self-pay

## 2015-10-25 DIAGNOSIS — I4891 Unspecified atrial fibrillation: Secondary | ICD-10-CM | POA: Insufficient documentation

## 2015-10-25 DIAGNOSIS — Z7901 Long term (current) use of anticoagulants: Secondary | ICD-10-CM | POA: Insufficient documentation

## 2015-10-25 DIAGNOSIS — I5032 Chronic diastolic (congestive) heart failure: Secondary | ICD-10-CM | POA: Diagnosis not present

## 2015-10-25 DIAGNOSIS — I2510 Atherosclerotic heart disease of native coronary artery without angina pectoris: Secondary | ICD-10-CM | POA: Diagnosis not present

## 2015-10-25 DIAGNOSIS — Z8673 Personal history of transient ischemic attack (TIA), and cerebral infarction without residual deficits: Secondary | ICD-10-CM | POA: Insufficient documentation

## 2015-10-25 DIAGNOSIS — Z79899 Other long term (current) drug therapy: Secondary | ICD-10-CM | POA: Insufficient documentation

## 2015-10-25 DIAGNOSIS — Z792 Long term (current) use of antibiotics: Secondary | ICD-10-CM | POA: Insufficient documentation

## 2015-10-25 DIAGNOSIS — Z7952 Long term (current) use of systemic steroids: Secondary | ICD-10-CM | POA: Diagnosis not present

## 2015-10-25 DIAGNOSIS — Z87440 Personal history of urinary (tract) infections: Secondary | ICD-10-CM | POA: Diagnosis not present

## 2015-10-25 DIAGNOSIS — N189 Chronic kidney disease, unspecified: Secondary | ICD-10-CM | POA: Diagnosis not present

## 2015-10-25 DIAGNOSIS — Z8739 Personal history of other diseases of the musculoskeletal system and connective tissue: Secondary | ICD-10-CM | POA: Insufficient documentation

## 2015-10-25 DIAGNOSIS — Z8709 Personal history of other diseases of the respiratory system: Secondary | ICD-10-CM | POA: Insufficient documentation

## 2015-10-25 DIAGNOSIS — R609 Edema, unspecified: Secondary | ICD-10-CM

## 2015-10-25 DIAGNOSIS — R0602 Shortness of breath: Secondary | ICD-10-CM | POA: Diagnosis not present

## 2015-10-25 DIAGNOSIS — I129 Hypertensive chronic kidney disease with stage 1 through stage 4 chronic kidney disease, or unspecified chronic kidney disease: Secondary | ICD-10-CM | POA: Insufficient documentation

## 2015-10-25 DIAGNOSIS — R600 Localized edema: Secondary | ICD-10-CM | POA: Insufficient documentation

## 2015-10-25 DIAGNOSIS — Z880 Allergy status to penicillin: Secondary | ICD-10-CM | POA: Diagnosis not present

## 2015-10-25 LAB — I-STAT TROPONIN, ED: TROPONIN I, POC: 0.02 ng/mL (ref 0.00–0.08)

## 2015-10-25 LAB — CBC
HEMATOCRIT: 35 % — AB (ref 36.0–46.0)
Hemoglobin: 11 g/dL — ABNORMAL LOW (ref 12.0–15.0)
MCH: 28.1 pg (ref 26.0–34.0)
MCHC: 31.4 g/dL (ref 30.0–36.0)
MCV: 89.5 fL (ref 78.0–100.0)
PLATELETS: 164 10*3/uL (ref 150–400)
RBC: 3.91 MIL/uL (ref 3.87–5.11)
RDW: 15.8 % — AB (ref 11.5–15.5)
WBC: 11.6 10*3/uL — AB (ref 4.0–10.5)

## 2015-10-25 LAB — BASIC METABOLIC PANEL
Anion gap: 8 (ref 5–15)
BUN: 16 mg/dL (ref 6–20)
CALCIUM: 8.6 mg/dL — AB (ref 8.9–10.3)
CO2: 23 mmol/L (ref 22–32)
CREATININE: 0.94 mg/dL (ref 0.44–1.00)
Chloride: 111 mmol/L (ref 101–111)
GFR calc Af Amer: >60 mL/min (ref >60)
GFR, EST NON AFRICAN AMERICAN: 54 mL/min — AB (ref >60)
Glucose, Bld: 99 mg/dL (ref 65–99)
POTASSIUM: 3.9 mmol/L (ref 3.5–5.1)
SODIUM: 142 mmol/L (ref 135–145)

## 2015-10-25 LAB — URINE MICROSCOPIC-ADD ON

## 2015-10-25 LAB — URINALYSIS, ROUTINE W REFLEX MICROSCOPIC
Bilirubin Urine: NEGATIVE
GLUCOSE, UA: NEGATIVE mg/dL
Ketones, ur: NEGATIVE mg/dL
Nitrite: POSITIVE — AB
PH: 6 (ref 5.0–8.0)
PROTEIN: NEGATIVE mg/dL
Specific Gravity, Urine: 1.011 (ref 1.005–1.030)

## 2015-10-25 MED ORDER — POTASSIUM CHLORIDE ER 10 MEQ PO TBCR: 10.0000 meq | EXTENDED_RELEASE_TABLET | Freq: Two times a day (BID) | ORAL | Status: DC

## 2015-10-25 NOTE — ED Notes (Addendum)
Pt reports onset 3-4 days left hand swelling and has noticed increase in usual shortness of breath.  Per daughter swelling in bilateral LE has worsened today.  No chest pain.  Per pts daughter pt has had recent bronchitis and has been on several antibiotics and prednisone, No prednisone since 10-20-15.

## 2015-10-25 NOTE — Discharge Instructions (Signed)
Increase your Lasix dosage to 40 mg morning, and 40 mg evening 5 days. Increase your potassium dosage to morning, and evening for 5 days. Follow-up with your physician if your edema has not resolved, and to recheck your potassium.   Edema Edema is an abnormal buildup of fluids in your bodytissues. Edema is somewhatdependent on gravity to pull the fluid to the lowest place in your body. That makes the condition more common in the legs and thighs (lower extremities). Painless swelling of the feet and ankles is common and becomes more likely as you get older. It is also common in looser tissues, like around your eyes.  When the affected area is squeezed, the fluid may move out of that spot and leave a dent for a few moments. This dent is called pitting.  CAUSES  There are many possible causes of edema. Eating too much salt and being on your feet or sitting for a long time can cause edema in your legs and ankles. Hot weather may make edema worse. Common medical causes of edema include:  Heart failure.  Liver disease.  Kidney disease.  Weak blood vessels in your legs.  Cancer.  An injury.  Pregnancy.  Some medications.  Obesity. SYMPTOMS  Edema is usually painless.Your skin may look swollen or shiny.  DIAGNOSIS  Your health care provider may be able to diagnose edema by asking about your medical history and doing a physical exam. You may need to have tests such as X-rays, an electrocardiogram, or blood tests to check for medical conditions that may cause edema.  TREATMENT  Edema treatment depends on the cause. If you have heart, liver, or kidney disease, you need the treatment appropriate for these conditions. General treatment may include:  Elevation of the affected body part above the level of your heart.  Compression of the affected body part. Pressure from elastic bandages or support stockings squeezes the tissues and forces fluid back into the blood vessels. This keeps fluid  from entering the tissues.  Restriction of fluid and salt intake.  Use of a water pill (diuretic). These medications are appropriate only for some types of edema. They pull fluid out of your body and make you urinate more often. This gets rid of fluid and reduces swelling, but diuretics can have side effects. Only use diuretics as directed by your health care provider. HOME CARE INSTRUCTIONS   Keep the affected body part above the level of your heart when you are lying down.   Do not sit still or stand for prolonged periods.   Do not put anything directly under your knees when lying down.  Do not wear constricting clothing or garters on your upper legs.   Exercise your legs to work the fluid back into your blood vessels. This may help the swelling go down.   Wear elastic bandages or support stockings to reduce ankle swelling as directed by your health care provider.   Eat a low-salt diet to reduce fluid if your health care provider recommends it.   Only take medicines as directed by your health care provider. SEEK MEDICAL CARE IF:   Your edema is not responding to treatment.  You have heart, liver, or kidney disease and notice symptoms of edema.  You have edema in your legs that does not improve after elevating them.   You have sudden and unexplained weight gain. SEEK IMMEDIATE MEDICAL CARE IF:   You develop shortness of breath or chest pain.   You cannot breathe  when you lie down.  You develop pain, redness, or warmth in the swollen areas.   You have heart, liver, or kidney disease and suddenly get edema.  You have a fever and your symptoms suddenly get worse. MAKE SURE YOU:   Understand these instructions.  Will watch your condition.  Will get help right away if you are not doing well or get worse.   This information is not intended to replace advice given to you by your health care provider. Make sure you discuss any questions you have with your health  care provider.   Document Released: 07/18/2005 Document Revised: 08/08/2014 Document Reviewed: 05/10/2013 Elsevier Interactive Patient Education [employer]2016 Elsevier Inc.

## 2015-10-25 NOTE — ED Provider Notes (Signed)
CSN: [id number]     Arrival date & time 10/25/15  1437 History   First MD Initiated Contact with Patient 10/25/15 1628     Chief Complaint  Patient presents with  . Shortness of Breath  . Edema      HPI  Patient presents with her daughter from home with a complaint of increasing lower extremity edema, and new left hand edema. They both feel that this is likely started about Friday, 2 days ago. She has some "soreness" in her left hand but does not recall any injury.  Family relates the story back to February 26 or she presented at an urgent care center with a cough. Had a normal x-ray. Was diagnosed with bronchitis and urinary tract infection. Placed on Levaquin. 2 days later received a call was changed to sulfa based on culture. Seen by primary care physician a few days later with continued cough and placed on prednisone and an inhaler. Cough persisted. Seen on the 12th by her primary care physician Dr. Valentina Lucks and placed on an albuterol inhaler and increase in her prednisone. Has off prednisone for a week. Continues with the inhaler. Edema started on Friday.  No chest pain. No fever shakes chills. No dysuria. Was asymptomatic to her UTI in February. Does not have PND, orthopnea.  Does carry a diagnosis of permanent atrial fibrillation, congestive heart failure, coronary artery disease. Follows with Dr. Anne Fu of cardiology. Use on same dose of Lasix without change.  History of mitral valve replacement surger  Past Medical History  Diagnosis Date  . S/P mitral valve repair 10/07/2009  . S/P Maze operation for atrial fibrillation 10/07/2009  . Essential hypertension, benign   . ASCVD (arteriosclerotic cardiovascular disease)     SV,AWMI June 97, PTCA/stent LAD 1997, PTCA diagonal 08/07  . Exertional dyspnea     Chronic  . Overweight   . Chronic diastolic heart failure (HCC)   . Atrial fibrillation (HCC)   . Chronic kidney disease   . Osteoporosis   . Equilibrium disorder     Chronic   . Stroke [facility])    Past Surgical History  Procedure Laterality Date  . Right miniature thoracotomy for mitral valve repair  10/07/2009    28-mm Sorin Memo 3-D ring annuloplasty  . Cox maze procedure and ligation of left femoral av fistula  10/07/2009    Dr Cornelius Moras  . Total abdominal hysterectomy w/ bilateral salpingoophorectomy    . Appendectomy     Family History  Problem Relation Age of Onset  . Stroke Mother   . Stroke Father   . Bone cancer Brother    Social History  Substance Use Topics  . Smoking status: Never Smoker   . Smokeless tobacco: Never Used  . Alcohol Use: No   OB History    No data available     Review of Systems  Constitutional: Negative for fever, chills, diaphoresis, appetite change and fatigue.  HENT: Negative for mouth sores, sore throat and trouble swallowing.   Eyes: Negative for visual disturbance.  Respiratory: Negative for cough, chest tightness, shortness of breath and wheezing.   Cardiovascular: Positive for leg swelling. Negative for chest pain.  Gastrointestinal: Negative for nausea, vomiting, abdominal pain, diarrhea and abdominal distention.  Endocrine: Negative for polydipsia, polyphagia and polyuria.  Genitourinary: Negative for dysuria, frequency and hematuria.  Musculoskeletal: Negative for gait problem.       Left hand "soreness" and swelling.  Skin: Negative for color change, pallor and rash.  Neurological: Negative  for dizziness, syncope, light-headedness and headaches.  Hematological: Does not bruise/bleed easily.  Psychiatric/Behavioral: Negative for behavioral problems and confusion.      Allergies  Darvocet; Morphine sulfate; Spironolactone; Codeine; Lisinopril; and Penicillins  Home Medications   Prior to Admission medications   Medication Sig Start Date End Date Taking? Authorizing Provider  acetaminophen (TYLENOL) 325 MG tablet Take 650 mg by mouth every 6 (six) hours as needed for mild pain.    Yes Historical Provider,  MD  amLODipine (NORVASC) 10 MG tablet TAKE 1 TABLET EVERY DAY Patient taking differently: Take 10 mg by mouth daily. TAKE 1 TABLET EVERY DAY 08/18/15  Yes Jake Bathe, MD  apixaban (ELIQUIS) 5 MG TABS tablet Take 1 tablet (5 mg total) by mouth 2 (two) times daily. 08/18/15  Yes Jake Bathe, MD  furosemide (LASIX) 40 MG tablet Take 1 tablet (40 mg total) by mouth 2 (two) times daily. Patient taking differently: Take 40 mg by mouth daily.  08/19/15  Yes Jake Bathe, MD  losartan (COZAAR) 100 MG tablet TAKE 1 TABLET EVERY DAY Patient taking differently: Take 100 mg by mouth every morning. TAKE 1 TABLET EVERY DAY 08/19/15  Yes Jake Bathe, MD  Multiple Vitamin (MULTIVITAMIN WITH MINERALS) TABS tablet Take 1 tablet by mouth daily.   Yes Historical Provider, MD  nitroGLYCERIN (NITROSTAT) 0.4 MG SL tablet Place 1 tablet (0.4 mg total) under the tongue every 5 (five) minutes as needed. 02/05/14  Yes Jake Bathe, MD  potassium chloride SA (K-DUR,KLOR-CON) 20 MEQ tablet Take 20 mEq by mouth daily.  12/23/14  Yes Historical Provider, MD  pravastatin (PRAVACHOL) 40 MG tablet TAKE ONE TABLET BY MOUTH EVERY DAY Patient taking differently: Take 40 mg by mouth every evening. TAKE ONE TABLET BY MOUTH EVERY DAY 08/18/15  Yes Jake Bathe, MD  albuterol (PROVENTIL) (2.5 MG/3ML) 0.083% nebulizer solution Take 3 mLs (2.5 mg total) by nebulization every 6 (six) hours as needed for wheezing or shortness of breath. 09/27/15   Sherren Mocha, MD  Albuterol Sulfate (PROAIR RESPICLICK) 108 (90 Base) MCG/ACT AEPB Inhale 2 puffs into the lungs every 4 (four) hours as needed. 09/27/15   Sherren Mocha, MD  ondansetron (ZOFRAN ODT) 4 MG disintegrating tablet Take 1 tablet (4 mg total) by mouth every 8 (eight) hours as needed for nausea or vomiting. 09/27/15   Sherren Mocha, MD  potassium chloride (K-DUR) 10 MEQ tablet Take 1 tablet (10 mEq total) by mouth 2 (two) times daily. 10/25/15   Rolland Porter, MD  predniSONE (DELTASONE) 20 MG tablet  Take 40 mg by mouth daily with breakfast. Reported on 10/25/2015    Historical Provider, MD  Spacer/Aero-Holding Chambers (AEROCHAMBER PLUS) inhaler Use as instructed 09/27/15   Sherren Mocha, MD  sulfamethoxazole-trimethoprim (BACTRIM DS,SEPTRA DS) 800-160 MG tablet Take 1 tablet by mouth 2 (two) times daily. 10/01/15   Sherren Mocha, MD   BP 138/72 mmHg  Pulse 104  Temp(Src) 99.4 F (37.4 C) (Oral)  Resp 16  Ht 5' (1.524 m)  Wt 147 lb 8 oz (66.906 kg)  BMI 28.81 kg/m2  SpO2 96% Physical Exam  Constitutional: She is oriented to person, place, and time. She appears well-developed and well-nourished. No distress.  HENT:  Head: Normocephalic.  Eyes: Conjunctivae are normal. Pupils are equal, round, and reactive to light. No scleral icterus.  Neck: Normal range of motion. Neck supple. No thyromegaly present.  Cardiovascular: Normal rate and regular rhythm.  Exam  reveals no gallop and no friction rub.   No murmur heard. Pulmonary/Chest: Effort normal and breath sounds normal. No respiratory distress. She has no wheezes. She has no rales.  Lungs are clear without crackles or rales. No JVD. No S3 or S4  gallop.  Abdominal: Soft. Bowel sounds are normal. She exhibits no distension. There is no tenderness. There is no rebound.  Musculoskeletal: Normal range of motion.  Left hand, bilateral lower extremity from knee down and show 2+ edema. Left hand is not erythematous. Range of motion.  Neurological: She is alert and oriented to person, place, and time.  Skin: Skin is warm and dry. No rash noted.  Psychiatric: She has a normal mood and affect. Her behavior is normal.    ED Course  Procedures (including critical care time) Labs Review Labs Reviewed  BASIC METABOLIC PANEL - Abnormal; Notable for the following:    Calcium 8.6 (*)    GFR calc non Af Amer 54 (*)    All other components within normal limits  CBC - Abnormal; Notable for the following:    WBC 11.6 (*)    Hemoglobin 11.0 (*)    HCT  35.0 (*)    RDW 15.8 (*)    All other components within normal limits  URINALYSIS, ROUTINE W REFLEX MICROSCOPIC (NOT AT [facility]) - Abnormal; Notable for the following:    APPearance CLOUDY (*)    Hgb urine dipstick SMALL (*)    Nitrite POSITIVE (*)    Leukocytes, UA TRACE (*)    All other components within normal limits  URINE MICROSCOPIC-ADD ON - Abnormal; Notable for the following:    Squamous Epithelial / LPF 6-30 (*)    Bacteria, UA MANY (*)    Casts GRANULAR CAST (*)    All other components within normal limits  URINE CULTURE  I-STAT TROPOININ, ED    Imaging Review Dg Chest 2 View  10/25/2015  CLINICAL DATA:  Short of breath EXAM: CHEST  2 VIEW COMPARISON:  09/29/2015 pain FINDINGS: Normal cardiac silhouette. There is volume loss in the RIGHT hemi thorax. Lungs are hyperinflated. No effusion, infiltrate or pneumothorax. IMPRESSION: Hyperinflated lungs without acute cardiopulmonary findings. Electronically Signed   By: Genevive BiStewart  Edmunds M.D.   On: 10/25/2015 16:01   Dg Hand Complete Left  10/25/2015  CLINICAL DATA:  Left hand swelling x 1 week; no known injury; pt reports intermittent pain along the middle finger EXAM: LEFT HAND - COMPLETE 3+ VIEW COMPARISON:  None. FINDINGS: No fracture. Joints are normally aligned. Several interphalangeal joints show asymmetric narrowing, greatest of the DIP joint of the fifth finger, consistent with mild osteoarthritis. Bones are diffusely demineralized. There is soft tissue swelling that is most evident dorsally. IMPRESSION: No fracture or dislocation.  Nonspecific soft tissue swelling. Electronically Signed   By: Amie Portlandavid  Ormond M.D.   On: 10/25/2015 19:28   I have personally reviewed and evaluated these images and lab results as part of my medical decision-making.   EKG Interpretation None      MDM   Final diagnoses:  Dependent edema    Increasing edema without clinical congestive heart failure. Her renal function is normal after being on  prolonged steroid therapy. Doubt addisonian's,  But may be may be some mineralocorticoid effects. We'll recheck UA. With the slight discomfort in the hand, I will  x-ray although she does not recall injury. May simply need increase short-term diuretic.  X-ray normal. Urine shows some cells. No symptoms or overt infection on  UA. We'll culture and contacted positive. Plan will be doubling Lasix for 5 days, doubling potassium for 5 days, PCP follow-up for recheck, and potassium check.    Rolland Porter, MD 10/25/15 2025

## 2015-10-27 LAB — URINE CULTURE: Culture: >=100000

## 2015-10-28 ENCOUNTER — Telehealth (HOSPITAL_BASED_OUTPATIENT_CLINIC_OR_DEPARTMENT_OTHER): Payer: Self-pay

## 2015-10-28 NOTE — Progress Notes (Signed)
ED Antimicrobial Stewardship Positive Culture Follow Up   Tami Ramirez is an 82 y.o. female who [facility] with a chief complaint of  Chief Complaint  Patient presents with  . Shortness of Breath  . Edema    Recent Results (from the past 720 hour(s))  Urine culture     Status: None   Collection Time: 10/25/15  6:37 PM  Result Value Ref Range Status   Specimen Description URINE, RANDOM  Final   Special Requests NONE  Final   Culture >=100,000 COLONIES/mL KLEBSIELLA PNEUMONIAE  Final   Report Status 10/27/2015 FINAL  Final   Organism ID, Bacteria KLEBSIELLA PNEUMONIAE  Final      Susceptibility   Klebsiella pneumoniae - MIC*    AMPICILLIN >=32 RESISTANT Resistant     CEFAZOLIN <=4 SENSITIVE Sensitive     CEFTRIAXONE <=1 SENSITIVE Sensitive     CIPROFLOXACIN <=0.25 SENSITIVE Sensitive     GENTAMICIN <=1 SENSITIVE Sensitive     IMIPENEM <=0.25 SENSITIVE Sensitive     NITROFURANTOIN 32 SENSITIVE Sensitive     TRIMETH/SULFA <=20 SENSITIVE Sensitive     AMPICILLIN/SULBACTAM 8 SENSITIVE Sensitive     PIP/TAZO 8 SENSITIVE Sensitive     * >=100,000 COLONIES/mL KLEBSIELLA PNEUMONIAE    [x]  Patient discharged originally without antimicrobial agent and treatment is NOT indicated  New antibiotic prescription: Presented with leg and hand swelling, recently on steroids. No urinary complaints. UA with many epithelial cells, likely contaminated. No treatment indicated for asymptomatic bacteruria in this patient.  ED Provider: Fayrene HelperBowie Tran, PA-C   Brittanyann Wittner A Nasiah Lehenbauer 10/28/2015, 9:26 AM Infectious Diseases Pharmacist Phone# [phone]

## 2015-10-28 NOTE — Telephone Encounter (Signed)
Post ED Visit - Positive Culture Follow-up  Culture report reviewed by antimicrobial stewardship pharmacist:  []  Enzo BiNathan Batchelder, Pharm.D. []  Celedonio MiyamotoJeremy Frens, Pharm.D., BCPS []  Garvin FilaMike Maccia, Pharm.D. []  Georgina PillionElizabeth Martin, Pharm.D., BCPS []  ApplegateMinh Pham, [address]Ph[address]D., BCPS, AAHIVP []  Estella HuskMichelle Turner, Pharm.D., BCPS, AAHIVP []  Tennis Mustassie Stewart, Pharm.D. []  Sherle Poeob Vincent, [address]Ph[address] Magan Decker Pharm D. Positive Urine culture No treatment required, no further patient follow-up is required at this time.  Jerry CarasCullom, Kemaria Dedic Burnett 10/28/2015, 10:56 AM

## 2015-11-27 ENCOUNTER — Ambulatory Visit (INDEPENDENT_AMBULATORY_CARE_PROVIDER_SITE_OTHER): Payer: Commercial Managed Care - HMO | Admitting: Cardiology

## 2015-11-27 ENCOUNTER — Encounter: Payer: Self-pay | Admitting: Cardiology

## 2015-11-27 VITALS — BP 132/72 | HR 76 | Ht 60.0 in | Wt 142.8 lb

## 2015-11-27 DIAGNOSIS — I5032 Chronic diastolic (congestive) heart failure: Secondary | ICD-10-CM

## 2015-11-27 DIAGNOSIS — Z9889 Other specified postprocedural states: Secondary | ICD-10-CM

## 2015-11-27 DIAGNOSIS — I2510 Atherosclerotic heart disease of native coronary artery without angina pectoris: Secondary | ICD-10-CM

## 2015-11-27 DIAGNOSIS — Z8679 Personal history of other diseases of the circulatory system: Secondary | ICD-10-CM

## 2015-11-27 DIAGNOSIS — I4891 Unspecified atrial fibrillation: Secondary | ICD-10-CM

## 2015-11-27 NOTE — Progress Notes (Signed)
Patient ID: Tami Ramirez, female   DOB: 07/16/1929, 82 y.o.   MRN: [id number]      [address] [place], [place]  [address] Phone: [phone] Fax:  [phone]  Date:  11/27/2015   ID:  Tami Ramirez, DOB 07/16/1929, MRN [id number]  PCP:  Tami MountainGRIFFIN,JOHN JOSEPH, MD   History of Present Illness: Tami Ramirez is a 82 y.o. female coronary artery disease prior stent in LAD in 1997, PTCA of diagonal following anterior wall myocardial infarction, mitral valve repair in March of 2011 due to severe mitral regurgitation.  Prior TIA, atrial fibrillation, Eliquis. I agree with anticoagulation.  She continues not to drive. Her speech impairment has resolved. There was some concern previously of UTI causing delirium.    Also has hypertension, hyperlipidemia.  She has had issues in the past with chronic diastolic heart failure. Previous creatinine 0.85. Feels OK. No CP, no significant SOB. No bleeding.  She has however had significant bronchitis treated with prednisone which could've resulted in increased lower extremity edema. Tami Ramirez has been working on this with Lasix administration.   Wt Readings from Last 3 Encounters:  11/27/15 142 lb 12.8 oz (64.774 kg)  10/25/15 147 lb 8 oz (66.906 kg)  09/27/15 133 lb (60.328 kg)     Past Medical History  Diagnosis Date  . S/P mitral valve repair 10/07/2009  . S/P Maze operation for atrial fibrillation 10/07/2009  . Essential hypertension, benign   . ASCVD (arteriosclerotic cardiovascular disease)     SV,AWMI June 97, PTCA/stent LAD 1997, PTCA diagonal 08/07  . Exertional dyspnea     Chronic  . Overweight   . Chronic diastolic heart failure (HCC)   . Atrial fibrillation (HCC)   . Chronic kidney disease   . Osteoporosis   . Equilibrium disorder     Chronic  . Stroke [facility](HCC)     Past Surgical History  Procedure Laterality Date  . Right miniature thoracotomy for mitral valve repair  10/07/2009    28-mm Sorin Memo 3-D ring  annuloplasty  . Cox maze procedure and ligation of left femoral av fistula  10/07/2009    Tami Cornelius Moraswen  . Total abdominal hysterectomy w/ bilateral salpingoophorectomy    . Appendectomy      Current Outpatient Prescriptions  Medication Sig Dispense Refill  . acetaminophen (TYLENOL) 325 MG tablet Take 650 mg by mouth every 6 (six) hours as needed for mild pain.     Marland Kitchen. albuterol (PROVENTIL) (2.5 MG/3ML) 0.083% nebulizer solution Take 3 mLs (2.5 mg total) by nebulization every 6 (six) hours as needed for wheezing or shortness of breath. 150 mL 0  . Albuterol Sulfate (PROAIR RESPICLICK) 108 (90 Base) MCG/ACT AEPB Inhale 2 puffs into the lungs every 4 (four) hours as needed. 1 each 0  . amLODipine (NORVASC) 10 MG tablet Take 10 mg by mouth daily.    Marland Kitchen. apixaban (ELIQUIS) 5 MG TABS tablet Take 1 tablet (5 mg total) by mouth 2 (two) times daily. 180 tablet 1  . furosemide (LASIX) 40 MG tablet Take 40 mg by mouth daily.    Marland Kitchen. losartan (COZAAR) 100 MG tablet Take 100 mg by mouth daily.    . Multiple Vitamin (MULTIVITAMIN WITH MINERALS) TABS tablet Take 1 tablet by mouth daily.    . nitroGLYCERIN (NITROSTAT) 0.4 MG SL tablet Place 1 tablet (0.4 mg total) under the tongue every 5 (five) minutes as needed. 25 tablet prn  . ondansetron (ZOFRAN ODT) 4 MG  disintegrating tablet Take 1 tablet (4 mg total) by mouth every 8 (eight) hours as needed for nausea or vomiting. 20 tablet 0  . potassium chloride (K-DUR) 10 MEQ tablet Take 1 tablet (10 mEq total) by mouth 2 (two) times daily. 10 tablet 0  . potassium chloride SA (K-DUR,KLOR-CON) 20 MEQ tablet Take 20 mEq by mouth daily.   1  . pravastatin (PRAVACHOL) 40 MG tablet Take 40 mg by mouth daily.    Marland Kitchen Spacer/Aero-Holding Chambers (AEROCHAMBER PLUS) inhaler Use as instructed 1 each 0   No current facility-administered medications for this visit.    Allergies:    Allergies  Allergen Reactions  . Morphine Sulfate Other (See Comments)    Low heart rate   .  Spironolactone Other (See Comments)    hyperkalemia  . Codeine Nausea Only  . Darvocet [Propoxyphene N-Acetaminophen] Other (See Comments)    Spaced out  . Lisinopril Cough  . Penicillins Rash    Social History:  The patient  reports that she has never smoked. She has never used smokeless tobacco. She reports that she does not drink alcohol or use illicit drugs.   ROS:  Please see the history of present illness.   Denies any bleeding, syncope, orthopnea, PND All other systems reviewed and negative.   PHYSICAL EXAM: VS:  BP 132/72 mmHg  Pulse 76  Ht 5' (1.524 m)  Wt 142 lb 12.8 oz (64.774 kg)  BMI 27.89 kg/m2 Well nourished, well developed, in no acute distress HEENT: normal Neck: no JVD Cardiac:  normal S1, S2; mildly tachycardic, ectopy, slightly irregular; no murmur Lungs:  clear to auscultation bilaterally, no wheezing, rhonchi or rales Abd: soft, nontender, no hepatomegaly Ext: 2+ bilateral lower extremity edema Skin: warm and dry Neuro: no focal abnormalities noted  EKG:   02/05/14-sinus tachycardia rate 109 with occasional PACs, nonspecific ST-T wave changes, inversion in lateral precordial leads, prior EKG showed Sinus rhythm, 96, occasional PACs, PVCs. Nonspecific T-wave flattening.     ASSESSMENT AND PLAN:  82 year old with paroxysmal atrial fibrillation status post mitral valve repair, maze procedure, CAD, hypertension, hyperlipidemia.  1. Stroke 4/16- dysphagia improved. No driving. Using a cane.  2. Atrial fibrillation-paroxysmal,  no EKG was done today however she does sound somewhat irregular. She has had PACs however in the past. Regardless, she is on Eliquis. TIA during April 2016 hospitalization. Normal creatinine.  CHADS-Vasc 6 , checking blood work today. 3. PACs-seen on EKG. Asymptomatic. 4. Chronic anticoagulation-agree with Eliquis 5 mg twice a day. No evidence of bleeding currently. Expressed or other concerns or risks of bleeding. 5. Status post mitral  valve repair. Doing well. Dental antibiotics. EF 50% 6. Hypertension-under good control. No changes made. With furosemide, checking basic metabolic profile. She has been out of her potassium for about a month and half. We will see where her potassium levels are before restarting. 7.  CAD-stable, no anginal symptoms. 8.  Chronic diastolic heart failure-continuing with furosemide. Tami. Valentina Lucks and recently doubled the dosage for a short period of time secondary to increased lower extremity edema in the setting of prednisone use with bronchitis.. Watching weight carefully. I encouraged them to go ahead and continue with the prescribed dosage by Tami. Valentina Lucks. 9.  We will see her back in 6 months.  Signed, Donato Schultz, MD [facility]  11/27/2015 11:05 AM

## 2015-11-27 NOTE — Patient Instructions (Signed)

## 2016-01-05 ENCOUNTER — Other Ambulatory Visit: Payer: Self-pay | Admitting: Cardiology

## 2016-01-12 ENCOUNTER — Other Ambulatory Visit: Payer: Self-pay | Admitting: Cardiology

## 2016-06-03 ENCOUNTER — Encounter: Payer: Self-pay | Admitting: Cardiology

## 2016-06-03 ENCOUNTER — Ambulatory Visit (INDEPENDENT_AMBULATORY_CARE_PROVIDER_SITE_OTHER): Payer: Commercial Managed Care - HMO | Admitting: Cardiology

## 2016-06-03 DIAGNOSIS — Z23 Encounter for immunization: Secondary | ICD-10-CM | POA: Diagnosis not present

## 2016-06-03 NOTE — Progress Notes (Signed)
Patient ID: Tami Ramirez, female   DOB: 1929/03/04, 82 y.o.   MRN: [id number]      [address] [place], [place]  [address] Phone: [phone] Fax:  [phone]  Date:  06/03/2016   ID:  Tami DikeLouise W Ramirez, DOB 1929/03/04, MRN [id number]  PCP:  Lillia MountainGRIFFIN,JOHN JOSEPH, MD   History of Present Illness: Tami RochLouise W Ramirez is a 82 y.o. female coronary artery disease prior stent in LAD in 1997, PTCA of diagonal following anterior wall myocardial infarction, mitral valve repair in March of 2011 due to severe mitral regurgitation.  Prior TIA, atrial fibrillation, Eliquis. I agree with anticoagulation.  She continues not to drive. Her speech impairment has resolved. There was some concern previously of UTI causing delirium.    Also has hypertension, hyperlipidemia.  She has had issues in the past with chronic diastolic heart failure. Previous creatinine 0.85. He has been experiencing some shortness of breath over the past 2 weeks. No bleeding.  She has however had significant bronchitis treated with prednisone which could've resulted in increased lower extremity edema. Dr. Valentina LucksGriffin has been working on this with Lasix administration.   Wt Readings from Last 3 Encounters:  06/03/16 140 lb 8 oz (63.7 kg)  11/27/15 142 lb 12.8 oz (64.8 kg)  10/25/15 147 lb 8 oz (66.9 kg)     Past Medical History:  Diagnosis Date  . ASCVD (arteriosclerotic cardiovascular disease)    SV,AWMI June 97, PTCA/stent LAD 1997, PTCA diagonal 08/07  . Atrial fibrillation (HCC)   . Chronic diastolic heart failure (HCC)   . Chronic kidney disease   . Equilibrium disorder    Chronic  . Essential hypertension, benign   . Exertional dyspnea    Chronic  . Osteoporosis   . Overweight   . S/P Maze operation for atrial fibrillation 10/07/2009  . S/P mitral valve repair 10/07/2009  . Stroke [facility](HCC)     Past Surgical History:  Procedure Laterality Date  . APPENDECTOMY    . Cox Maze procedure and ligation of left  femoral AV fistula  10/07/2009   Dr Cornelius Moraswen  . right miniature thoracotomy for mitral valve repair  10/07/2009   28-mm Sorin Memo 3-D ring annuloplasty  . TOTAL ABDOMINAL HYSTERECTOMY W/ BILATERAL SALPINGOOPHORECTOMY      Current Outpatient Prescriptions  Medication Sig Dispense Refill  . acetaminophen (TYLENOL) 325 MG tablet Take 650 mg by mouth every 6 (six) hours as needed for mild pain.     Marland Kitchen. albuterol (PROVENTIL) (2.5 MG/3ML) 0.083% nebulizer solution Take 3 mLs (2.5 mg total) by nebulization every 6 (six) hours as needed for wheezing or shortness of breath. 150 mL 0  . Albuterol Sulfate (PROAIR RESPICLICK) 108 (90 Base) MCG/ACT AEPB Inhale 2 puffs into the lungs every 4 (four) hours as needed. 1 each 0  . amLODipine (NORVASC) 10 MG tablet Take 1 tablet (10 mg total) by mouth daily. 90 tablet 3  . apixaban (ELIQUIS) 5 MG TABS tablet Take 1 tablet (5 mg total) by mouth 2 (two) times daily. 180 tablet 3  . furosemide (LASIX) 40 MG tablet TAKE 1 TABLET TWICE DAILY (Patient taking differently: once daily, increase to twice for swelling) 180 tablet 2  . losartan (COZAAR) 100 MG tablet TAKE 1 TABLET EVERY DAY 90 tablet 2  . Multiple Vitamin (MULTIVITAMIN WITH MINERALS) TABS tablet Take 1 tablet by mouth daily.    . ondansetron (ZOFRAN ODT) 4 MG disintegrating tablet Take 1 tablet (4 mg total) by  mouth every 8 (eight) hours as needed for nausea or vomiting. 20 tablet 0  . potassium chloride SA (K-DUR,KLOR-CON) 20 MEQ tablet Take 20 mEq by mouth daily.   1  . pravastatin (PRAVACHOL) 40 MG tablet Take 1 tablet (40 mg total) by mouth daily. 90 tablet 3  . Spacer/Aero-Holding Chambers (AEROCHAMBER PLUS) inhaler Use as instructed 1 each 0  . nitroGLYCERIN (NITROSTAT) 0.4 MG SL tablet Place 1 tablet (0.4 mg total) under the tongue every 5 (five) minutes as needed. (Patient not taking: Reported on 06/03/2016) 25 tablet prn   No current facility-administered medications for this visit.     Allergies:      Allergies  Allergen Reactions  . Morphine Sulfate Other (See Comments)    Low heart rate   . Penicillins Rash  . Spironolactone Other (See Comments)    hyperkalemia  . Codeine Nausea Only  . Darvocet [Propoxyphene N-Acetaminophen] Other (See Comments)    Spaced out  . Lisinopril Cough    Social History:  The patient  reports that she has never smoked. She has never used smokeless tobacco. She reports that she does not drink alcohol or use drugs.   ROS:  Please see the history of present illness.   Denies any bleeding, syncope, orthopnea, PND All other systems reviewed and negative.   PHYSICAL EXAM: VS:  BP (!) 140/100   Pulse 72   Ht 4\' 9"  (1.448 m)   Wt 140 lb 8 oz (63.7 kg)   BMI 30.40 kg/m  Well nourished, well developed, in no acute distress HEENT: normal Neck: no JVD Cardiac:  normal S1, S2; mildly tachycardic, ectopy, slightly irregular; no murmur Lungs:  clear to auscultation bilaterally, no wheezing, rhonchi or rales Abd: soft, nontender, no hepatomegaly Ext: 2+ bilateral lower extremity edema Skin: warm and dry Neuro: no focal abnormalities noted  EKG:   02/05/14-sinus tachycardia rate 109 with occasional PACs, nonspecific ST-T wave changes, inversion in lateral precordial leads, prior EKG showed Sinus rhythm, 96, occasional PACs, PVCs. Nonspecific T-wave flattening.     ASSESSMENT AND PLAN:  82 year old with paroxysmal atrial fibrillation status post mitral valve repair, maze procedure, CAD, hypertension, hyperlipidemia.  1. Stroke 4/16- dysphagia improved. No driving. Using a cane. Encouraged continued exercise, ambulation 2. Atrial fibrillation-likely permanent,  no EKG was done today however she does sound somewhat irregular. She has had PACs however in the past. Regardless, she is on Eliquis. TIA during April 2016 hospitalization. Normal creatinine.  CHADS-Vasc 6  3. PACs-seen on EKG. Asymptomatic. 4. Chronic anticoagulation-agree with Eliquis 5 mg twice a  day. No evidence of bleeding currently. Expressed or other concerns or risks of bleeding. 5. Status post mitral valve repair. Doing well. Dental antibiotics. EF 50% 6. Hypertension-usually under good control. No changes made. Elevated today 7.  CAD-stable, no anginal symptoms. Some shortness of breath noted, continue with endurance, exercise 8.  Chronic diastolic heart failure-continuing with furosemide. Her weight is down 7 pounds over the past few months. Excellent. She continues to wear support stockings. Some of the changes in her lower extremities are subcutaneous tissue. 9. We will see her back in 6 months.  Signed, Donato SchultzMark Skains, MD [facility]  06/03/2016 11:48 AM

## 2016-06-03 NOTE — Patient Instructions (Signed)

## 2016-06-14 ENCOUNTER — Telehealth: Payer: Self-pay | Admitting: Cardiology

## 2016-06-14 NOTE — Telephone Encounter (Signed)
Daughter aware (DPR on file) no generic available for Eliquis.

## 2016-06-14 NOTE — Telephone Encounter (Signed)
New Message  Pts daughter voiced she is filling out form and pts daughter wants to know if it's okay for pt to take generic form of eliquis.  Please f/u with pts daughter

## 2016-08-08 DIAGNOSIS — G8918 Other acute postprocedural pain: Secondary | ICD-10-CM | POA: Diagnosis not present

## 2016-08-08 DIAGNOSIS — M439 Deforming dorsopathy, unspecified: Secondary | ICD-10-CM | POA: Diagnosis not present

## 2016-08-08 DIAGNOSIS — R262 Difficulty in walking, not elsewhere classified: Secondary | ICD-10-CM | POA: Diagnosis not present

## 2016-08-08 DIAGNOSIS — I509 Heart failure, unspecified: Secondary | ICD-10-CM | POA: Diagnosis not present

## 2016-08-18 ENCOUNTER — Encounter (HOSPITAL_COMMUNITY): Payer: Self-pay | Admitting: *Deleted

## 2016-08-18 ENCOUNTER — Inpatient Hospital Stay (HOSPITAL_COMMUNITY)
Admission: EM | Admit: 2016-08-18 | Discharge: 2016-08-22 | DRG: 563 | Disposition: A | Discharge status: Skilled Nursing Facility | Payer: PPO | Source: Ambulatory Visit | Attending: Internal Medicine | Admitting: Internal Medicine

## 2016-08-18 ENCOUNTER — Emergency Department (HOSPITAL_COMMUNITY): Payer: PPO

## 2016-08-18 DIAGNOSIS — M25562 Pain in left knee: Secondary | ICD-10-CM | POA: Diagnosis not present

## 2016-08-18 DIAGNOSIS — Z888 Allergy status to other drugs, medicaments and biological substances status: Secondary | ICD-10-CM

## 2016-08-18 DIAGNOSIS — I5032 Chronic diastolic (congestive) heart failure: Secondary | ICD-10-CM | POA: Diagnosis not present

## 2016-08-18 DIAGNOSIS — S82202A Unspecified fracture of shaft of left tibia, initial encounter for closed fracture: Secondary | ICD-10-CM | POA: Diagnosis present

## 2016-08-18 DIAGNOSIS — R262 Difficulty in walking, not elsewhere classified: Secondary | ICD-10-CM | POA: Diagnosis present

## 2016-08-18 DIAGNOSIS — W182XXA Fall in (into) shower or empty bathtub, initial encounter: Secondary | ICD-10-CM | POA: Diagnosis present

## 2016-08-18 DIAGNOSIS — N189 Chronic kidney disease, unspecified: Secondary | ICD-10-CM | POA: Diagnosis present

## 2016-08-18 DIAGNOSIS — S3992XA Unspecified injury of lower back, initial encounter: Secondary | ICD-10-CM | POA: Diagnosis not present

## 2016-08-18 DIAGNOSIS — Z7901 Long term (current) use of anticoagulants: Secondary | ICD-10-CM | POA: Diagnosis not present

## 2016-08-18 DIAGNOSIS — I10 Essential (primary) hypertension: Secondary | ICD-10-CM | POA: Diagnosis present

## 2016-08-18 DIAGNOSIS — I130 Hypertensive heart and chronic kidney disease with heart failure and stage 1 through stage 4 chronic kidney disease, or unspecified chronic kidney disease: Secondary | ICD-10-CM | POA: Diagnosis present

## 2016-08-18 DIAGNOSIS — Z880 Allergy status to penicillin: Secondary | ICD-10-CM | POA: Diagnosis not present

## 2016-08-18 DIAGNOSIS — Z90710 Acquired absence of both cervix and uterus: Secondary | ICD-10-CM

## 2016-08-18 DIAGNOSIS — Y92091 Bathroom in other non-institutional residence as the place of occurrence of the external cause: Secondary | ICD-10-CM | POA: Diagnosis not present

## 2016-08-18 DIAGNOSIS — I4891 Unspecified atrial fibrillation: Secondary | ICD-10-CM | POA: Diagnosis not present

## 2016-08-18 DIAGNOSIS — M6281 Muscle weakness (generalized): Secondary | ICD-10-CM

## 2016-08-18 DIAGNOSIS — S82142A Displaced bicondylar fracture of left tibia, initial encounter for closed fracture: Secondary | ICD-10-CM | POA: Diagnosis not present

## 2016-08-18 DIAGNOSIS — I2510 Atherosclerotic heart disease of native coronary artery without angina pectoris: Secondary | ICD-10-CM | POA: Diagnosis present

## 2016-08-18 DIAGNOSIS — M810 Age-related osteoporosis without current pathological fracture: Secondary | ICD-10-CM | POA: Diagnosis present

## 2016-08-18 DIAGNOSIS — M25462 Effusion, left knee: Secondary | ICD-10-CM | POA: Diagnosis present

## 2016-08-18 DIAGNOSIS — S22089A Unspecified fracture of T11-T12 vertebra, initial encounter for closed fracture: Secondary | ICD-10-CM

## 2016-08-18 DIAGNOSIS — S82209A Unspecified fracture of shaft of unspecified tibia, initial encounter for closed fracture: Secondary | ICD-10-CM | POA: Diagnosis present

## 2016-08-18 DIAGNOSIS — Z8679 Personal history of other diseases of the circulatory system: Secondary | ICD-10-CM

## 2016-08-18 DIAGNOSIS — I482 Chronic atrial fibrillation: Secondary | ICD-10-CM | POA: Diagnosis present

## 2016-08-18 DIAGNOSIS — I272 Pulmonary hypertension, unspecified: Secondary | ICD-10-CM | POA: Diagnosis present

## 2016-08-18 DIAGNOSIS — Z8673 Personal history of transient ischemic attack (TIA), and cerebral infarction without residual deficits: Secondary | ICD-10-CM

## 2016-08-18 DIAGNOSIS — D649 Anemia, unspecified: Secondary | ICD-10-CM | POA: Diagnosis not present

## 2016-08-18 DIAGNOSIS — Z9889 Other specified postprocedural states: Secondary | ICD-10-CM | POA: Diagnosis not present

## 2016-08-18 DIAGNOSIS — S82145A Nondisplaced bicondylar fracture of left tibia, initial encounter for closed fracture: Secondary | ICD-10-CM | POA: Diagnosis not present

## 2016-08-18 DIAGNOSIS — Z79899 Other long term (current) drug therapy: Secondary | ICD-10-CM | POA: Diagnosis not present

## 2016-08-18 DIAGNOSIS — S22080A Wedge compression fracture of T11-T12 vertebra, initial encounter for closed fracture: Secondary | ICD-10-CM | POA: Diagnosis present

## 2016-08-18 DIAGNOSIS — W19XXXA Unspecified fall, initial encounter: Secondary | ICD-10-CM

## 2016-08-18 DIAGNOSIS — T148XXA Other injury of unspecified body region, initial encounter: Secondary | ICD-10-CM | POA: Diagnosis not present

## 2016-08-18 DIAGNOSIS — J9811 Atelectasis: Secondary | ICD-10-CM | POA: Diagnosis present

## 2016-08-18 DIAGNOSIS — Z885 Allergy status to narcotic agent status: Secondary | ICD-10-CM

## 2016-08-18 DIAGNOSIS — S82143A Displaced bicondylar fracture of unspecified tibia, initial encounter for closed fracture: Secondary | ICD-10-CM

## 2016-08-18 DIAGNOSIS — S79922A Unspecified injury of left thigh, initial encounter: Secondary | ICD-10-CM | POA: Diagnosis not present

## 2016-08-18 DIAGNOSIS — Z823 Family history of stroke: Secondary | ICD-10-CM

## 2016-08-18 DIAGNOSIS — S82102A Unspecified fracture of upper end of left tibia, initial encounter for closed fracture: Secondary | ICD-10-CM | POA: Diagnosis not present

## 2016-08-18 HISTORY — DX: Displaced bicondylar fracture of unspecified tibia, initial encounter for closed fracture: S82.143A

## 2016-08-18 LAB — CBC WITH DIFFERENTIAL/PLATELET
BASOS PCT: 0 %
Basophils Absolute: 0 10*3/uL (ref 0.0–0.1)
EOS PCT: 0 %
Eosinophils Absolute: 0 10*3/uL (ref 0.0–0.7)
HEMATOCRIT: 32.7 % — AB (ref 36.0–46.0)
HEMOGLOBIN: 9.9 g/dL — AB (ref 12.0–15.0)
LYMPHS PCT: 13 %
Lymphs Abs: 1.8 10*3/uL (ref 0.7–4.0)
MCH: 23.7 pg — AB (ref 26.0–34.0)
MCHC: 30.3 g/dL (ref 30.0–36.0)
MCV: 78.2 fL (ref 78.0–100.0)
Monocytes Absolute: 1 10*3/uL (ref 0.1–1.0)
Monocytes Relative: 7 %
NEUTROS ABS: 11.3 10*3/uL — AB (ref 1.7–7.7)
Neutrophils Relative %: 80 %
Platelets: 234 10*3/uL (ref 150–400)
RBC: 4.18 MIL/uL (ref 3.87–5.11)
RDW: 16 % — ABNORMAL HIGH (ref 11.5–15.5)
WBC: 14.1 10*3/uL — ABNORMAL HIGH (ref 4.0–10.5)

## 2016-08-18 LAB — BASIC METABOLIC PANEL
ANION GAP: 12 (ref 5–15)
BUN: 15 mg/dL (ref 6–20)
CALCIUM: 9 mg/dL (ref 8.9–10.3)
CO2: 21 mmol/L — AB (ref 22–32)
Chloride: 109 mmol/L (ref 101–111)
Creatinine, Ser: 0.93 mg/dL (ref 0.44–1.00)
GFR calc Af Amer: >60 mL/min (ref >60)
GFR calc non Af Amer: 54 mL/min — ABNORMAL LOW (ref >60)
GLUCOSE: 119 mg/dL — AB (ref 65–99)
Potassium: 4.1 mmol/L (ref 3.5–5.1)
Sodium: 142 mmol/L (ref 135–145)

## 2016-08-18 MED ORDER — FUROSEMIDE 40 MG PO TABS
40.0000 mg | ORAL_TABLET | Freq: Two times a day (BID) | ORAL | Status: DC
  Administered 2016-08-19 – 2016-08-22 (×7): 40 mg via ORAL
  Filled 2016-08-18 (×5): qty 1

## 2016-08-18 MED ORDER — ACETAMINOPHEN 650 MG RE SUPP: 650.0000 mg | Freq: Four times a day (QID) | RECTAL | Status: DC | PRN

## 2016-08-18 MED ORDER — LOSARTAN POTASSIUM 50 MG PO TABS
100.0000 mg | ORAL_TABLET | Freq: Every day | ORAL | Status: DC
  Administered 2016-08-19 – 2016-08-22 (×2): 100 mg via ORAL
  Filled 2016-08-18 (×4): qty 2

## 2016-08-18 MED ORDER — ALBUTEROL SULFATE 108 (90 BASE) MCG/ACT IN AEPB: 2.0000 | INHALATION_SPRAY | RESPIRATORY_TRACT | Status: DC | PRN

## 2016-08-18 MED ORDER — ALBUTEROL SULFATE (2.5 MG/3ML) 0.083% IN NEBU: 2.5000 mg | INHALATION_SOLUTION | Freq: Four times a day (QID) | RESPIRATORY_TRACT | Status: DC | PRN

## 2016-08-18 MED ORDER — PRAVASTATIN SODIUM 40 MG PO TABS
40.0000 mg | ORAL_TABLET | Freq: Every day | ORAL | Status: DC
  Administered 2016-08-19 – 2016-08-22 (×4): 40 mg via ORAL
  Filled 2016-08-18 (×4): qty 1

## 2016-08-18 MED ORDER — ONDANSETRON 4 MG PO TBDP: 4.0000 mg | ORAL_TABLET | Freq: Three times a day (TID) | ORAL | Status: DC | PRN

## 2016-08-18 MED ORDER — FENTANYL CITRATE (PF) 100 MCG/2ML IJ SOLN
50.0000 ug | Freq: Once | INTRAMUSCULAR | Status: AC
  Administered 2016-08-18: 50 ug via INTRAVENOUS
  Filled 2016-08-18: qty 2

## 2016-08-18 MED ORDER — ONDANSETRON HCL 4 MG/2ML IJ SOLN
4.0000 mg | Freq: Once | INTRAMUSCULAR | Status: AC
  Administered 2016-08-18: 4 mg via INTRAVENOUS
  Filled 2016-08-18: qty 2

## 2016-08-18 MED ORDER — APIXABAN 5 MG PO TABS
5.0000 mg | ORAL_TABLET | Freq: Two times a day (BID) | ORAL | Status: DC
  Administered 2016-08-18 – 2016-08-22 (×8): 5 mg via ORAL
  Filled 2016-08-18 (×8): qty 1

## 2016-08-18 MED ORDER — POTASSIUM CHLORIDE CRYS ER 10 MEQ PO TBCR
10.0000 meq | EXTENDED_RELEASE_TABLET | Freq: Every day | ORAL | Status: DC
  Administered 2016-08-19 – 2016-08-22 (×4): 10 meq via ORAL
  Filled 2016-08-18 (×4): qty 1

## 2016-08-18 MED ORDER — ONDANSETRON HCL 4 MG/2ML IJ SOLN
4.0000 mg | Freq: Four times a day (QID) | INTRAMUSCULAR | Status: DC | PRN
  Administered 2016-08-19: 4 mg via INTRAVENOUS
  Filled 2016-08-18 (×2): qty 2

## 2016-08-18 MED ORDER — FENTANYL CITRATE (PF) 100 MCG/2ML IJ SOLN
25.0000 ug | INTRAMUSCULAR | Status: DC | PRN
  Administered 2016-08-19 – 2016-08-20 (×3): 25 ug via INTRAVENOUS
  Filled 2016-08-18 (×3): qty 2

## 2016-08-18 MED ORDER — ACETAMINOPHEN 325 MG PO TABS
650.0000 mg | ORAL_TABLET | Freq: Four times a day (QID) | ORAL | Status: DC | PRN
  Administered 2016-08-19 – 2016-08-22 (×6): 650 mg via ORAL
  Filled 2016-08-18 (×6): qty 2

## 2016-08-18 MED ORDER — ADULT MULTIVITAMIN W/MINERALS CH
1.0000 | ORAL_TABLET | Freq: Every day | ORAL | Status: DC
  Administered 2016-08-19 – 2016-08-22 (×4): 1 via ORAL
  Filled 2016-08-18 (×4): qty 1

## 2016-08-18 MED ORDER — AMLODIPINE BESYLATE 10 MG PO TABS
10.0000 mg | ORAL_TABLET | Freq: Every day | ORAL | Status: DC
  Administered 2016-08-19 – 2016-08-22 (×2): 10 mg via ORAL
  Filled 2016-08-18 (×4): qty 1

## 2016-08-18 MED ORDER — NITROGLYCERIN 0.4 MG SL SUBL: 0.4000 mg | SUBLINGUAL_TABLET | SUBLINGUAL | Status: DC | PRN

## 2016-08-18 MED ORDER — ONDANSETRON HCL 4 MG PO TABS
4.0000 mg | ORAL_TABLET | Freq: Four times a day (QID) | ORAL | Status: DC | PRN
  Administered 2016-08-22: 4 mg via ORAL
  Filled 2016-08-18: qty 1

## 2016-08-18 NOTE — H&P (Signed)
History and Physical    Tami Ramirez:[id number] DOB: Mar 11, 1929 DOA: 08/18/2016  PCP: Lillia Mountain, MD  Tami Ramirez.  Chief Complaint: Fall.  HPI: Tami Ramirez is a 83 y.o. female with history of atrial fibrillation, diastolic CHF, history of stroke, hypertension was brought to the ER after Tami had a fall. Tami usually lives at home but due to change in weather condition and snow Tami was temporarily placed in assisted living [facility]. After taking a shower Tami stepped out of the bathroom when Tami slipped and fell. Denies hitting her head or losing consciousness. Due to persistent pain in the low back and left knee Tami was brought to the ER and x-rays revealed left tibial lateral fracture and T11 compression fracture. Tami is finding it difficult to ambulate due to pain. Tami is being admitted for further management.   ED Course: X-rays revealed left tibial plateau fracture and T11 compression fracture.  Review of Systems: As per HPI, rest all negative.   Past Medical History:  Diagnosis Date  . ASCVD (arteriosclerotic cardiovascular disease)    SV,AWMI June 97, PTCA/stent LAD 1997, PTCA diagonal 08/07  . Atrial fibrillation (HCC)   . Chronic diastolic heart failure (HCC)   . Chronic kidney disease   . Equilibrium disorder    Chronic  . Essential hypertension, benign   . Exertional dyspnea    Chronic  . Osteoporosis   . Overweight   . S/P Maze operation for atrial fibrillation 10/07/2009  . S/P mitral valve repair 10/07/2009  . Stroke [facility])     Past Surgical History:  Procedure Laterality Date  . APPENDECTOMY    . Cox Maze procedure and ligation of left femoral AV fistula  10/07/2009   Dr Cornelius Moras  . right miniature thoracotomy for mitral valve repair  10/07/2009   28-mm Sorin Memo 3-D ring annuloplasty  . TOTAL ABDOMINAL HYSTERECTOMY W/ BILATERAL SALPINGOOPHORECTOMY       reports that she has never  smoked. She has never used smokeless tobacco. She reports that she does not drink alcohol or use drugs.  Allergies  Allergen Reactions  . Morphine Sulfate Other (See Comments)    Low heart rate   . Penicillins Rash  . Spironolactone Other (See Comments)    hyperkalemia  . Codeine Nausea Only  . Darvocet [Propoxyphene N-Acetaminophen] Other (See Comments)    Spaced out  . Lisinopril Cough    Family History  Problem Relation Age of Onset  . Stroke Mother   . Stroke Father   . Bone cancer Brother     Prior to Admission medications   Medication Sig Start Date End Date Taking? Authorizing Provider  acetaminophen (TYLENOL) 325 MG tablet Take 650 mg by mouth every 6 (six) hours as needed for mild pain.    Yes Historical Provider, MD  albuterol (PROVENTIL) (2.5 MG/3ML) 0.083% nebulizer solution Take 3 mLs (2.5 mg total) by nebulization every 6 (six) hours as needed for wheezing or shortness of breath. 09/27/15  Yes Sherren Mocha, MD  amLODipine (NORVASC) 10 MG tablet Take 1 tablet (10 mg total) by mouth daily. 01/05/16  Yes Jake Bathe, MD  apixaban (ELIQUIS) 5 MG TABS tablet Take 1 tablet (5 mg total) by mouth 2 (two) times daily. 01/05/16  Yes Jake Bathe, MD  furosemide (LASIX) 40 MG tablet TAKE 1 TABLET TWICE DAILY Tami taking differently: once daily, increase to twice for swelling 01/13/16  Yes Jake Bathe, MD  losartan (COZAAR) 100 MG tablet TAKE 1 TABLET EVERY DAY 01/13/16  Yes Jake Bathe, MD  Multiple Vitamin (MULTIVITAMIN WITH MINERALS) TABS tablet Take 1 tablet by mouth daily.   Yes Historical Provider, MD  nitroGLYCERIN (NITROSTAT) 0.4 MG SL tablet Place 1 tablet (0.4 mg total) under the tongue every 5 (five) minutes as needed. 02/05/14  Yes Jake Bathe, MD  ondansetron (ZOFRAN ODT) 4 MG disintegrating tablet Take 1 tablet (4 mg total) by mouth every 8 (eight) hours as needed for nausea or vomiting. 09/27/15  Yes Sherren Mocha, MD  potassium chloride SA (K-DUR,KLOR-CON) 20 MEQ  tablet Take 10 mEq by mouth daily.  12/23/14  Yes Historical Provider, MD  pravastatin (PRAVACHOL) 40 MG tablet Take 1 tablet (40 mg total) by mouth daily. 01/05/16  Yes Jake Bathe, MD  Albuterol Sulfate (PROAIR RESPICLICK) 108 (90 Base) MCG/ACT AEPB Inhale 2 puffs into the lungs every 4 (four) hours as needed. 09/27/15   Sherren Mocha, MD  Spacer/Aero-Holding Chambers (AEROCHAMBER PLUS) inhaler Use as instructed 09/27/15   Sherren Mocha, MD    Physical Exam: Vitals:   08/18/16 1900 08/18/16 1915 08/18/16 1944 08/18/16 2000  BP: 143/60 137/70  156/71  Pulse: 109 104  107  Resp:  16    Temp:      TempSrc:      SpO2: 95% 96%  97%  Weight:   65.8 kg (145 lb)       Constitutional: Moderately built and nourished. Vitals:   08/18/16 1900 08/18/16 1915 08/18/16 1944 08/18/16 2000  BP: 143/60 137/70  156/71  Pulse: 109 104  107  Resp:  16    Temp:      TempSrc:      SpO2: 95% 96%  97%  Weight:   65.8 kg (145 lb)    Eyes: Anicteric no pallor. ENMT: No discharge from the ears eyes nose or mouth. Neck: No mass felt. No neck rigidity. Respiratory: No rhonchi or crepitations. Cardiovascular: S1-S2 heard no murmurs appreciated. Abdomen: Soft nontender bowel sounds present. No guarding or rigidity. Musculoskeletal: Left knee is in immobilizer. Skin: No rash. Skin appears warm.  Neurologic: Alert awake oriented to time place and person. Moves all extremities. Psychiatric: Appears normal. Normal affect.   Labs on Admission: I have personally reviewed following labs and imaging studies  CBC:  Recent Labs Lab 08/18/16 1845  WBC 14.1*  NEUTROABS 11.3*  HGB 9.9*  HCT 32.7*  MCV 78.2  PLT 234   Basic Metabolic Panel:  Recent Labs Lab 08/18/16 1845  NA 142  K 4.1  CL 109  CO2 21*  GLUCOSE 119*  BUN 15  CREATININE 0.93  CALCIUM 9.0   GFR: Estimated Creatinine Clearance: 34.6 mL/min (by C-G formula based on SCr of 0.93 mg/dL). Liver Function Tests: No results for input(s):  AST, ALT, ALKPHOS, BILITOT, PROT, ALBUMIN in the last 168 hours. No results for input(s): LIPASE, AMYLASE in the last 168 hours. No results for input(s): AMMONIA in the last 168 hours. Coagulation Profile: No results for input(s): INR, PROTIME in the last 168 hours. Cardiac Enzymes: No results for input(s): CKTOTAL, CKMB, CKMBINDEX, TROPONINI in the last 168 hours. BNP (last 3 results) No results for input(s): PROBNP in the last 8760 hours. HbA1C: No results for input(s): HGBA1C in the last 72 hours. CBG: No results for input(s): GLUCAP in the last 168 hours. Lipid Profile: No results for input(s): CHOL, HDL, LDLCALC, TRIG, CHOLHDL, LDLDIRECT in the last  72 hours. Thyroid Function Tests: No results for input(s): TSH, T4TOTAL, FREET4, T3FREE, THYROIDAB in the last 72 hours. Anemia Panel: No results for input(s): VITAMINB12, FOLATE, FERRITIN, TIBC, IRON, RETICCTPCT in the last 72 hours. Urine analysis:    Component Value Date/Time   COLORURINE YELLOW 10/25/2015 1837   APPEARANCEUR CLOUDY (A) 10/25/2015 1837   LABSPEC 1.011 10/25/2015 1837   PHURINE 6.0 10/25/2015 1837   GLUCOSEU NEGATIVE 10/25/2015 1837   HGBUR SMALL (A) 10/25/2015 1837   BILIRUBINUR NEGATIVE 10/25/2015 1837   BILIRUBINUR negative 09/27/2015 1246   KETONESUR NEGATIVE 10/25/2015 1837   PROTEINUR NEGATIVE 10/25/2015 1837   UROBILINOGEN 0.2 09/27/2015 1246   UROBILINOGEN 0.2 11/18/2014 0035   NITRITE POSITIVE (A) 10/25/2015 1837   LEUKOCYTESUR TRACE (A) 10/25/2015 1837   Sepsis Labs: @LABRCNTIP (procalcitonin:4,lacticidven:4) )No results found for this or any previous visit (from the past 240 hour(s)).   Radiological Exams on Admission: Dg Lumbar Spine Complete  Result Date: 08/18/2016 CLINICAL DATA:  Fall EXAM: LUMBAR SPINE - COMPLETE 4+ VIEW COMPARISON:  CT abdomen pelvis 09/21/2009 FINDINGS: Mild scoliosis of the spine. Lumbar alignment within normal limits. Vertebral body heights are grossly maintained.  Minimal superior endplate concavity at T11. Moderate-to-marked narrowing at L2-L3 and L3-L4 with endplate changes and osteophyte. Atherosclerotic vascular calcifications IMPRESSION: 1. Minimal superior endplate concavity at T11 uncertain chronicity, correlate clinically for point tenderness 2. Otherwise no radiographic evidence for acute osseous abnormality Electronically Signed   By: Jasmine PangKim  Fujinaga M.D.   On: 08/18/2016 18:07   Dg Knee Complete 4 Views Left  Result Date: 08/18/2016 CLINICAL DATA:  83 year old female with history of trauma from a fall while taking a shower this morning complaining of left knee pain. EXAM: LEFT KNEE - COMPLETE 4+ VIEW COMPARISON:  No priors. FINDINGS: There is an oblique intra-articular fracture through the lateral tibial plateau which appears nondisplaced. Small suprapatellar effusion. Femur and fibula appear intact. Overlying soft tissues are swollen. IMPRESSION: 1. Nondisplaced oblique intra-articular fracture through the lateral tibial plateau. Electronically Signed   By: Trudie Reedaniel  Entrikin M.D.   On: 08/18/2016 18:06   Dg Femur Port Min 2 Views Left  Result Date: 08/18/2016 CLINICAL DATA:  Fall EXAM: LEFT FEMUR PORTABLE 2 VIEWS COMPARISON:  None. FINDINGS: Surgical clips at the left groin. The left femoral head projects in joint. No fracture or dislocation involving proximal left femur. Partially visualized lateral tibial plateau fracture. IMPRESSION: 1. No acute osseous abnormality of the imaged portions of proximal left femur. 2. Partially visualized fracture of the lateral tibial plateau. Electronically Signed   By: Jasmine PangKim  Fujinaga M.D.   On: 08/18/2016 18:01     Assessment/Plan Principal Problem:   Left tibial fracture Active Problems:   S/P mitral valve repair   S/P Maze operation for atrial fibrillation   Essential hypertension, benign   Atrial fibrillation (HCC)   Chronic diastolic heart failure (HCC)   Ambulatory dysfunction   Closed T11 fracture (HCC)    Tibial fracture    1. Left tibial fracture and T11 compression fracture status post mechanical fall - Dr. Mikal Planeabell, on-call neurosurgeon was consulted by ER physician. Neurosurgeon has advised T LSO brace and follow-up with him in one week. I have also ordered an MRI of the T-spine. I have discussed with Dr. Shon BatonBrooks on-call orthopedic surgeon about the left tibial fracture. Dr. Shon BatonBrooks has advised to get a CT scan of the left knee which I have ordered. Dr. Shon BatonBrooks will be seeing Tami in consult. I have also requested physical therapy  consult and social work consult for possible placement. 2. Chronic atrial fibrillation - on Apixaban. Chads 2 vasc score is more than 2. 3. Chronic diastolic CHF - continue Lasix and potassium. 4. Hypertension on Cozaar and amlodipine. 5. History of mitral valve repair and maze procedure. 6. History of CVA - on statins and apixaban. 7. Chronic anemia with mild worsening of hemoglobin - follow CBC.   DVT prophylaxis: Apixaban. Code Status: Full code.  Family Communication: Discussed with Tami.  Disposition Plan: To be determined.  Consults called: Orthopedic surgery.  Admission status: Observation.    Eduard Clos MD [facility] Pager [phone].  If 7PM-7AM, please contact night-coverage [email or web address] Password [email or web address]  08/18/2016, 10:50 PM

## 2016-08-18 NOTE — ED Notes (Signed)
Attempted report x1. 

## 2016-08-18 NOTE — ED Triage Notes (Signed)
Information provided by EMS staff. Pt currently at an assisted living facility and fell in shower this am. Pt reported pain in her Lt knee and to Lt thigh when touched . Per EMS Staff Pt neg for shortening of Lt leg or rotation.. Pt reported she has had surgery on Lt knee ? Type of surgery.

## 2016-08-18 NOTE — ED Notes (Signed)
Ortho tech called to apply TSLO brace and knee immobilizer.

## 2016-08-18 NOTE — ED Notes (Addendum)
Per Alphonzo LemmingsWhitney, RN, orthopedic will apply TLSO brace tomorrow morning.

## 2016-08-18 NOTE — ED Notes (Signed)
Ortho tech informed RN that TSLO brace may not be fitted and put on until tomorrow. Ortho tech to called to see.

## 2016-08-18 NOTE — Care Management Note (Addendum)
Case Management Note  Patient Details  Name: Tami Ramirez MRN: [id number] Date of Birth: 1929/07/24  Subjective/Objective:                  Patient presented to [facility] ED with c/o knee pain s/p fall.  Action/Plan: CM met with patient and daughter Tami Ramirez [phone]) HCPOA at bedside. Patient lives alone but due to weather patient was placement in ALF for  a week where she fell getting out of the shower. Patient and family are now considering patient needing an increase level of care. ED CM explained the Unit CM and CSW will follow up for transitional care needs.  Expected Discharge Date:                  Expected Discharge Plan:  Skilled Nursing Facility  In-House Referral:  Clinical Social Work  Discharge planning Services  CM Consult  Post Acute Care Choice:    Choice offered to:  Patient, Adult Children  DME Arranged:    DME Agency:     HH Arranged:    [place] Agency:     Status of Service:  In process, will continue to follow  If discussed at Long Length of Stay Meetings, dates discussed:    Additional CommentsLaurena Slimmer, RN 08/18/2016, 8:44 PM

## 2016-08-18 NOTE — ED Provider Notes (Addendum)
[facility]-EMERGENCY DEPT Provider Note   CSN: [id number] Arrival date & time: 08/18/16  1655     History   Chief Complaint Chief Complaint  Patient presents with  . Fall  . Knee Pain    HPI Tami Ramirez is a 83 y.o. female.  Pt presents to the ED today with left knee pain s/p fall.  The pt normally lives at home by herself, but due to the snow, the family put her in an assisted living facility for a few days.  Pt said that they were helping her with the shower when pt slipped on the wet floor.  She was unable to walk after the fall.  She also c/o pain to her low back.  Pt is on Eliquis for a.fib (Chadvasc score 8), but did not hit her head.      Past Medical History:  Diagnosis Date  . ASCVD (arteriosclerotic cardiovascular disease)    SV,AWMI June 97, PTCA/stent LAD 1997, PTCA diagonal 08/07  . Atrial fibrillation (HCC)   . Chronic diastolic heart failure (HCC)   . Chronic kidney disease   . Equilibrium disorder    Chronic  . Essential hypertension, benign   . Exertional dyspnea    Chronic  . Osteoporosis   . Overweight   . S/P Maze operation for atrial fibrillation 10/07/2009  . S/P mitral valve repair 10/07/2009  . Stroke [facility])     Patient Active Problem List   Diagnosis Date Noted  . Ambulatory dysfunction 08/18/2016  . TIA (transient ischemic attack) 11/18/2014  . Coronary atherosclerosis of native coronary artery 04/21/2013  . Pure hypercholesterolemia 04/21/2013  . Essential hypertension, benign 04/21/2013  . Mitral valve disorder 04/21/2013  . Atrial fibrillation (HCC) 04/21/2013  . Chronic diastolic heart failure (HCC) 04/21/2013  . Unspecified late effects of cerebrovascular disease 04/21/2013  . S/P mitral valve repair 10/07/2009  . S/P Maze operation for atrial fibrillation 10/07/2009    Past Surgical History:  Procedure Laterality Date  . APPENDECTOMY    . Cox Maze procedure and ligation of left femoral AV fistula  10/07/2009   Dr Cornelius Moras  . right  miniature thoracotomy for mitral valve repair  10/07/2009   28-mm Sorin Memo 3-D ring annuloplasty  . TOTAL ABDOMINAL HYSTERECTOMY W/ BILATERAL SALPINGOOPHORECTOMY      OB History    No data available       Home Medications    Prior to Admission medications   Medication Sig Start Date End Date Taking? Authorizing Provider  acetaminophen (TYLENOL) 325 MG tablet Take 650 mg by mouth every 6 (six) hours as needed for mild pain.    Yes Historical Provider, MD  albuterol (PROVENTIL) (2.5 MG/3ML) 0.083% nebulizer solution Take 3 mLs (2.5 mg total) by nebulization every 6 (six) hours as needed for wheezing or shortness of breath. 09/27/15  Yes Sherren Mocha, MD  amLODipine (NORVASC) 10 MG tablet Take 1 tablet (10 mg total) by mouth daily. 01/05/16  Yes Jake Bathe, MD  apixaban (ELIQUIS) 5 MG TABS tablet Take 1 tablet (5 mg total) by mouth 2 (two) times daily. 01/05/16  Yes Jake Bathe, MD  furosemide (LASIX) 40 MG tablet TAKE 1 TABLET TWICE DAILY Patient taking differently: once daily, increase to twice for swelling 01/13/16  Yes Jake Bathe, MD  losartan (COZAAR) 100 MG tablet TAKE 1 TABLET EVERY DAY 01/13/16  Yes Jake Bathe, MD  Multiple Vitamin (MULTIVITAMIN WITH MINERALS) TABS tablet Take 1 tablet by mouth daily.  Yes Historical Provider, MD  nitroGLYCERIN (NITROSTAT) 0.4 MG SL tablet Place 1 tablet (0.4 mg total) under the tongue every 5 (five) minutes as needed. 02/05/14  Yes Jake Bathe, MD  ondansetron (ZOFRAN ODT) 4 MG disintegrating tablet Take 1 tablet (4 mg total) by mouth every 8 (eight) hours as needed for nausea or vomiting. 09/27/15  Yes Sherren Mocha, MD  potassium chloride SA (K-DUR,KLOR-CON) 20 MEQ tablet Take 10 mEq by mouth daily.  12/23/14  Yes Historical Provider, MD  pravastatin (PRAVACHOL) 40 MG tablet Take 1 tablet (40 mg total) by mouth daily. 01/05/16  Yes Jake Bathe, MD  Albuterol Sulfate (PROAIR RESPICLICK) 108 (90 Base) MCG/ACT AEPB Inhale 2 puffs into the lungs every  4 (four) hours as needed. 09/27/15   Sherren Mocha, MD  Spacer/Aero-Holding Chambers (AEROCHAMBER PLUS) inhaler Use as instructed 09/27/15   Sherren Mocha, MD    Family History Family History  Problem Relation Age of Onset  . Stroke Mother   . Stroke Father   . Bone cancer Brother     Social History Social History  Substance Use Topics  . Smoking status: Never Smoker  . Smokeless tobacco: Never Used  . Alcohol use No     Allergies   Morphine sulfate; Penicillins; Spironolactone; Codeine; Darvocet [propoxyphene n-acetaminophen]; and Lisinopril   Review of Systems Review of Systems  Musculoskeletal:       Left knee, back  All other systems reviewed and are negative.    Physical Exam Updated Vital Signs BP 137/70   Pulse 104   Temp 98.2 F (36.8 C) (Oral)   Resp 16   SpO2 96%   Physical Exam  Constitutional: She is oriented to person, place, and time. She appears well-developed and well-nourished.  HENT:  Head: Normocephalic and atraumatic.  Right Ear: External ear normal.  Left Ear: External ear normal.  Nose: Nose normal.  Mouth/Throat: Oropharynx is clear and moist.  Eyes: Conjunctivae and EOM are normal. Pupils are equal, round, and reactive to light.  Neck: Normal range of motion. Neck supple.  Cardiovascular: Normal rate, regular rhythm, normal heart sounds and intact distal pulses.   Pulmonary/Chest: Effort normal and breath sounds normal.  Abdominal: Soft. Bowel sounds are normal.  Musculoskeletal:       Left knee: She exhibits swelling and bony tenderness.       Lumbar back: She exhibits bony tenderness.  Neurological: She is alert and oriented to person, place, and time.  Skin: Skin is warm.  Psychiatric: She has a normal mood and affect. Her behavior is normal. Judgment and thought content normal.  Nursing note and vitals reviewed.    ED Treatments / Results  Labs (all labs ordered are listed, but only abnormal results are displayed) Labs Reviewed    BASIC METABOLIC PANEL  CBC WITH DIFFERENTIAL/PLATELET    EKG  EKG Interpretation None       Radiology Dg Lumbar Spine Complete  Result Date: 08/18/2016 CLINICAL DATA:  Fall EXAM: LUMBAR SPINE - COMPLETE 4+ VIEW COMPARISON:  CT abdomen pelvis 09/21/2009 FINDINGS: Mild scoliosis of the spine. Lumbar alignment within normal limits. Vertebral body heights are grossly maintained. Minimal superior endplate concavity at T11. Moderate-to-marked narrowing at L2-L3 and L3-L4 with endplate changes and osteophyte. Atherosclerotic vascular calcifications IMPRESSION: 1. Minimal superior endplate concavity at T11 uncertain chronicity, correlate clinically for point tenderness 2. Otherwise no radiographic evidence for acute osseous abnormality Electronically Signed   By: Adrian Prows.D.  On: 08/18/2016 18:07   Dg Knee Complete 4 Views Left  Result Date: 08/18/2016 CLINICAL DATA:  83 year old female with history of trauma from a fall while taking a shower this morning complaining of left knee pain. EXAM: LEFT KNEE - COMPLETE 4+ VIEW COMPARISON:  No priors. FINDINGS: There is an oblique intra-articular fracture through the lateral tibial plateau which appears nondisplaced. Small suprapatellar effusion. Femur and fibula appear intact. Overlying soft tissues are swollen. IMPRESSION: 1. Nondisplaced oblique intra-articular fracture through the lateral tibial plateau. Electronically Signed   By: Trudie Reedaniel  Entrikin M.D.   On: 08/18/2016 18:06   Dg Femur Port Min 2 Views Left  Result Date: 08/18/2016 CLINICAL DATA:  Fall EXAM: LEFT FEMUR PORTABLE 2 VIEWS COMPARISON:  None. FINDINGS: Surgical clips at the left groin. The left femoral head projects in joint. No fracture or dislocation involving proximal left femur. Partially visualized lateral tibial plateau fracture. IMPRESSION: 1. No acute osseous abnormality of the imaged portions of proximal left femur. 2. Partially visualized fracture of the lateral tibial  plateau. Electronically Signed   By: Jasmine PangKim  Fujinaga M.D.   On: 08/18/2016 18:01    Procedures Procedures (including critical care time)  Medications Ordered in ED Medications  ondansetron (ZOFRAN) injection 4 mg (4 mg Intravenous Given 08/18/16 1846)  fentaNYL (SUBLIMAZE) injection 50 mcg (50 mcg Intravenous Given 08/18/16 1847)     Initial Impression / Assessment and Plan / ED Course  I have reviewed the triage vital signs and the nursing notes.  Pertinent labs & imaging results that were available during my care of the patient were reviewed by me and considered in my medical decision making (see chart for details).    Pt is unable to ambulate.  Pt d/w hospitalist (Dr. Toniann FailKakrakandy)  for admission.  Dr. Toniann FailKakrakandy asked that I speak with NS regarding the T11 compression.  I spoke with Dr. Franky Machoabbell who suggested a TLSO brace and f/u with him 1 week after discharge.  Final Clinical Impressions(s) / ED Diagnoses   Final diagnoses:  Tibial plateau fracture, left, closed, initial encounter  Closed wedge compression fracture of eleventh thoracic vertebra, initial encounter [facility](HCC)    New Prescriptions New Prescriptions   No medications on file     Jacalyn LefevreJulie Eisen Robenson, MD 08/18/16 1925    Jacalyn LefevreJulie Dedrick Heffner, MD 08/18/16 1940

## 2016-08-18 NOTE — Progress Notes (Signed)
Orthopedic Tech Progress Note Patient Details:  [name] Nov 11, 1928 [id number]  Ortho Devices Type of Ortho Device: Knee Immobilizer Ortho Device/Splint Location: lle Ortho Device/Splint Interventions: Ordered, Application   Trinna PostMartinez, Joeangel Jeanpaul J 08/18/2016, 8:21 PM

## 2016-08-19 ENCOUNTER — Encounter (HOSPITAL_COMMUNITY): Payer: Self-pay | Admitting: General Practice

## 2016-08-19 ENCOUNTER — Observation Stay (HOSPITAL_COMMUNITY): Payer: PPO

## 2016-08-19 DIAGNOSIS — S82102A Unspecified fracture of upper end of left tibia, initial encounter for closed fracture: Secondary | ICD-10-CM | POA: Diagnosis not present

## 2016-08-19 DIAGNOSIS — W19XXXA Unspecified fall, initial encounter: Secondary | ICD-10-CM | POA: Diagnosis not present

## 2016-08-19 DIAGNOSIS — S82142A Displaced bicondylar fracture of left tibia, initial encounter for closed fracture: Secondary | ICD-10-CM | POA: Diagnosis not present

## 2016-08-19 DIAGNOSIS — Z9889 Other specified postprocedural states: Secondary | ICD-10-CM | POA: Diagnosis not present

## 2016-08-19 DIAGNOSIS — D72829 Elevated white blood cell count, unspecified: Secondary | ICD-10-CM

## 2016-08-19 DIAGNOSIS — S82202A Unspecified fracture of shaft of left tibia, initial encounter for closed fracture: Secondary | ICD-10-CM | POA: Diagnosis not present

## 2016-08-19 DIAGNOSIS — I10 Essential (primary) hypertension: Secondary | ICD-10-CM

## 2016-08-19 DIAGNOSIS — I5032 Chronic diastolic (congestive) heart failure: Secondary | ICD-10-CM | POA: Diagnosis not present

## 2016-08-19 LAB — CBC
HEMATOCRIT: 27.5 % — AB (ref 36.0–46.0)
Hemoglobin: 8.3 g/dL — ABNORMAL LOW (ref 12.0–15.0)
MCH: 23.7 pg — AB (ref 26.0–34.0)
MCHC: 30.2 g/dL (ref 30.0–36.0)
MCV: 78.6 fL (ref 78.0–100.0)
Platelets: 222 10*3/uL (ref 150–400)
RBC: 3.5 MIL/uL — ABNORMAL LOW (ref 3.87–5.11)
RDW: 16 % — AB (ref 11.5–15.5)
WBC: 10.9 10*3/uL — ABNORMAL HIGH (ref 4.0–10.5)

## 2016-08-19 LAB — BASIC METABOLIC PANEL
Anion gap: 7 (ref 5–15)
BUN: 14 mg/dL (ref 6–20)
CO2: 27 mmol/L (ref 22–32)
Calcium: 8.5 mg/dL — ABNORMAL LOW (ref 8.9–10.3)
Chloride: 108 mmol/L (ref 101–111)
Creatinine, Ser: 0.93 mg/dL (ref 0.44–1.00)
GFR calc Af Amer: >60 mL/min (ref >60)
GFR, EST NON AFRICAN AMERICAN: 54 mL/min — AB (ref >60)
GLUCOSE: 108 mg/dL — AB (ref 65–99)
POTASSIUM: 4.3 mmol/L (ref 3.5–5.1)
Sodium: 142 mmol/L (ref 135–145)

## 2016-08-19 NOTE — Progress Notes (Signed)
[facility] PROGRESS NOTE  DEEM MARMOL ZOX:[id number] DOB: 07-07-1929 DOA: 08/18/2016 PCP: Lillia Mountain, MD  Assessment/Plan:  83 y.o. female with history of atrial fibrillation(on apixaban), diastolic CHF, history of stroke, hypertension was brought to the ER after patient had a fall and found to have left tibial fracture, t12 compression fracture    Left tibial fracture. CT: Schatzker 2 lateral split -depressed fracture of the lateral tibial plateau. Small knee effusion -cont pain control, awaiting final orthopedics recommendations, pend physical therapy consult and social work consult for possible placement.  T11 compression fracture status post mechanical fall Dr. Mikal Plane, on-call neurosurgeon was consulted by ER physician. Neurosurgeon has advised T LSO brace and follow-up with him in one week.  -cont pain control, exam is non focal. awaiting MRI of the T-spine.  Chronic atrial fibrillation - on Apixaban. Chads 2 vasc score is more than 2. Chronic diastolic CHF, pulmonary HTN. History of mitral valve repair and maze procedure. -clinically euvolemic. - continue Lasix and potassium. Hypertension on Cozaar and amlodipine. History of CVA - on statins and apixaban. Chronic anemia with mild worsening of hemoglobin, but no s/s of active bleeding. Monitor Hg Leukocytosis. Afebrile. No respiratory symptoms. Check UA   Code Status: full Family Communication: d/w patient, rn (indicate person spoken with, relationship, and if by phone, the number) Disposition Plan: pend PT   Consultants:  Orthopedics   Procedures:  none  Antibiotics:  none (indicate start date, and stop date if known)  HPI/Subjective: Alert, reports back pain. Improved with iv fentanyl   Objective: Vitals:   08/18/16 2049 08/19/16 0654  BP: (!) 150/56 (!) 128/55  Pulse: (!) 103 (!) 102  Resp: 17 16  Temp: 98.9 F (37.2 C) 98.9 F (37.2 C)    Intake/Output Summary (Last 24 hours) at  08/19/16 0948 Last data filed at 08/19/16 0900  Gross per 24 hour  Intake              120 ml  Output                0 ml  Net              120 ml   Filed Weights   08/18/16 1944  Weight: 65.8 kg (145 lb)    Exam:   General:  Alert, no distress   Cardiovascular: s1,s2 rrr  Respiratory: CT ABL  Abdomen: soft, nt, nd   Musculoskeletal: no pedal edema   Data Reviewed: Basic Metabolic Panel:  Recent Labs Lab 08/18/16 1845 08/19/16 0340  NA 142 142  K 4.1 4.3  CL 109 108  CO2 21* 27  GLUCOSE 119* 108*  BUN 15 14  CREATININE 0.93 0.93  CALCIUM 9.0 8.5*   Liver Function Tests: No results for input(s): AST, ALT, ALKPHOS, BILITOT, PROT, ALBUMIN in the last 168 hours. No results for input(s): LIPASE, AMYLASE in the last 168 hours. No results for input(s): AMMONIA in the last 168 hours. CBC:  Recent Labs Lab 08/18/16 1845 08/19/16 0340  WBC 14.1* 10.9*  NEUTROABS 11.3*  --   HGB 9.9* 8.3*  HCT 32.7* 27.5*  MCV 78.2 78.6  PLT 234 222   Cardiac Enzymes: No results for input(s): CKTOTAL, CKMB, CKMBINDEX, TROPONINI in the last 168 hours. BNP (last 3 results) No results for input(s): BNP in the last 8760 hours.  ProBNP (last 3 results) No results for input(s): PROBNP in the last 8760 hours.  CBG: No results for input(s): GLUCAP in the last  168 hours.  No results found for this or any previous visit (from the past 240 hour(s)).   Studies: Dg Lumbar Spine Complete  Result Date: 08/18/2016 CLINICAL DATA:  Fall EXAM: LUMBAR SPINE - COMPLETE 4+ VIEW COMPARISON:  CT abdomen pelvis 09/21/2009 FINDINGS: Mild scoliosis of the spine. Lumbar alignment within normal limits. Vertebral body heights are grossly maintained. Minimal superior endplate concavity at T11. Moderate-to-marked narrowing at L2-L3 and L3-L4 with endplate changes and osteophyte. Atherosclerotic vascular calcifications IMPRESSION: 1. Minimal superior endplate concavity at T11 uncertain chronicity,  correlate clinically for point tenderness 2. Otherwise no radiographic evidence for acute osseous abnormality Electronically Signed   By: Jasmine Pang M.D.   On: 08/18/2016 18:07   Ct Knee Left Wo Contrast  Result Date: 08/19/2016 CLINICAL DATA:  Left knee pain, fall in bathtub yesterday, lateral tibial plateau fracture. EXAM: CT OF THE left KNEE WITHOUT CONTRAST TECHNIQUE: Multidetector CT imaging of the left knee was performed according to the standard protocol. Multiplanar CT image reconstructions were also generated. COMPARISON:  08/18/2016 FINDINGS: Bones/Joint/Cartilage Schatzker 2 lateral split-depressed fracture of the lateral tibial plateau. The central depressed component is displaced down about 7 mm laterally but not medially. The vertical fracture plane component is standard and extends into the tibial side of the proximal tibiofibular articulation. Small knee effusion.  Tricompartmental spurring. Ligaments Suboptimally assessed by CT. Muscles and Tendons Linear ossification in the patellar tendon with mild surrounding stranding. Spurring at the quadriceps attachment site. Soft tissues Subcutaneous edema inferolateral to the patella. IMPRESSION: 1. Schatzker 2 lateral split -depressed fracture of the lateral tibial plateau. Small knee effusion. 2. Tricompartmental spurring. Electronically Signed   By: Gaylyn Rong M.D.   On: 08/19/2016 08:13   Dg Knee Complete 4 Views Left  Result Date: 08/18/2016 CLINICAL DATA:  83 year old female with history of trauma from a fall while taking a shower this morning complaining of left knee pain. EXAM: LEFT KNEE - COMPLETE 4+ VIEW COMPARISON:  No priors. FINDINGS: There is an oblique intra-articular fracture through the lateral tibial plateau which appears nondisplaced. Small suprapatellar effusion. Femur and fibula appear intact. Overlying soft tissues are swollen. IMPRESSION: 1. Nondisplaced oblique intra-articular fracture through the lateral tibial  plateau. Electronically Signed   By: Trudie Reed M.D.   On: 08/18/2016 18:06   Dg Femur Port Min 2 Views Left  Result Date: 08/18/2016 CLINICAL DATA:  Fall EXAM: LEFT FEMUR PORTABLE 2 VIEWS COMPARISON:  None. FINDINGS: Surgical clips at the left groin. The left femoral head projects in joint. No fracture or dislocation involving proximal left femur. Partially visualized lateral tibial plateau fracture. IMPRESSION: 1. No acute osseous abnormality of the imaged portions of proximal left femur. 2. Partially visualized fracture of the lateral tibial plateau. Electronically Signed   By: Jasmine Pang M.D.   On: 08/18/2016 18:01    Scheduled Meds: . amLODipine  10 mg Oral Daily  . apixaban  5 mg Oral BID  . furosemide  40 mg Oral BID  . losartan  100 mg Oral Daily  . multivitamin with minerals  1 tablet Oral Daily  . potassium chloride SA  10 mEq Oral Daily  . pravastatin  40 mg Oral Daily   Continuous Infusions:  Principal Problem:   Left tibial fracture Active Problems:   S/P mitral valve repair   S/P Maze operation for atrial fibrillation   Essential hypertension, benign   Atrial fibrillation (HCC)   Chronic diastolic heart failure (HCC)   Ambulatory dysfunction  Closed T11 fracture (HCC)   Tibial fracture    Time spent: >35 minutes     Esperanza SheetsBURIEV, Marisela Line N  [facility] Pager [phone]. If 7PM-7AM, please contact night-coverage at [email or web address], password [email or web address] 08/19/2016, 9:48 AM  LOS: 0 days

## 2016-08-19 NOTE — Evaluation (Signed)
Physical Therapy Evaluation Patient Details Name: Tami Ramirez MRN: [id number] DOB: [birth year] Today's Date: 08/19/2016   History of Present Illness  83 yo female admitted through ED on 08/18/16 following a fall in the shower at an ALF where she was staying short term resulting in a T11 fx and left tibial fracture. Pt is wearing TLSO for T11 fracture and is currently non-operative but awaiting MD consultation. PMH significant for ASCVD, A fib, chornic diaslolic heart failure, CKD, equilibrium disorder, HTN, dyspnea, mitral valve repair and maze operation for a-fib 10/07/09, Op, Stroke.   Clinical Impression  Pt presents with the above diagnosis for therapy evaluation. Prior to admission, pt was living at home receiving assistance daily for ADLs and IADLs as listed below. Pt was able to perform short distance gait with Dennis but per daughter was safer with Rw. Pt was in TLSO while supine in bed although order states when walking. Pt requires Mod-Max A for bed mobs to EOB and will require +2 for any OOB mobility. In order to maximize her outcomes, pt will require SNF upon discharge. Pt will benefit from continuing to be seen acutely address below deficits in order to assist with transition to SNF.     Follow Up Recommendations SNF    Equipment Recommendations  Other (comment) (defer to next venue of care)    Recommendations for Other Services OT consult     Precautions / Restrictions Precautions Precautions: Fall Precaution Comments: Fall resulting in current hospitalization Required Braces or Orthoses: Spinal Brace;Knee Immobilizer - Left Knee Immobilizer - Left: On at all times Spinal Brace: Thoracolumbosacral orthotic;Applied in sitting position;Other (comment) Spinal Brace Comments: On when walking  Restrictions Weight Bearing Restrictions: Yes LLE Weight Bearing: Non weight bearing      Mobility  Bed Mobility Overal bed mobility: Needs Assistance Bed Mobility: Supine to Sit      Supine to sit: HOB elevated;Mod assist;+2 for safety/equipment     General bed mobility comments: HOB fully elevated and assisted pt EOB with brace in place. Pt requires assistance to maintain LLE in extension when sitting EOB  Transfers                 General transfer comment: Unable to assess transfers this session. Pt refusing  Ambulation/Gait             General Gait Details: Unable to assess gait this session for safety due to NWB LLE  Stairs            Wheelchair Mobility    Modified Rankin (Stroke Patients Only)       Balance Overall balance assessment: Needs assistance Sitting-balance support: Bilateral upper extremity supported;Feet supported Sitting balance-Leahy Scale: Fair Sitting balance - Comments: Uses railings to assist with balance at EOB Postural control: Posterior lean                                   Pertinent Vitals/Pain Pain Assessment: Faces Pain Score: Asleep Faces Pain Scale: Hurts little more Pain Location: low back and LLE with mobility Pain Descriptors / Indicators: Aching;Guarding;Grimacing;Discomfort;Moaning Pain Intervention(s): Monitored during session;Premedicated before session;Repositioned;Limited activity within patient's tolerance    Home Living Family/patient expects to be discharged to:: Skilled nursing facility Living Arrangements: Alone               Additional Comments: Family is making accomodations at home to make it acccesible when she returns home.  Prior Function Level of Independence: Needs assistance   Gait / Transfers Assistance Needed: has been using cane at baseline, but family feels RW is safer.   ADL's / Homemaking Assistance Needed: has caregivers 5 days a week for 5 hrs in Am, 2 nights a week for 2 hours and family fills in the remainder of the days.   Comments: Has WC transport to church, uses [employer] for meals and short distance gait     Hand Dominance   Dominant  Hand: Right    Extremity/Trunk Assessment   Upper Extremity Assessment Upper Extremity Assessment: Defer to OT evaluation    Lower Extremity Assessment Lower Extremity Assessment: LLE deficits/detail LLE Deficits / Details: At least 3/5 ankle, requires assistance move LLE EOB LLE: Unable to fully assess due to immobilization;Unable to fully assess due to pain    Cervical / Trunk Assessment Cervical / Trunk Assessment: Other exceptions Cervical / Trunk Exceptions: in TLSO  Communication   Communication: No difficulties  Cognition Arousal/Alertness: Awake/alert Behavior During Therapy: Agitated Overall Cognitive Status: Impaired/Different from baseline Area of Impairment: Orientation;Memory;Safety/judgement Orientation Level: Disoriented to;Time   Memory: Decreased short-term memory   Safety/Judgement: Decreased awareness of safety     General Comments: Pt is very resistance to any movement. Requires slow movement toward EOB.     General Comments General comments (skin integrity, edema, etc.): Daughter present throughout session and is on board with SNF placement for short term rehab    Exercises Total Joint Exercises Ankle Circles/Pumps: AROM;Both;20 reps;Supine   Assessment/Plan    PT Assessment Patient needs continued PT services  PT Problem List Decreased strength;Decreased range of motion;Decreased activity tolerance;Decreased balance;Decreased mobility;Pain;Decreased cognition;Decreased safety awareness          PT Treatment Interventions Functional mobility training;Therapeutic activities;Therapeutic exercise;DME instruction;Gait training;Balance training;Patient/family education    PT Goals (Current goals can be found in the Care Plan section)  Acute Rehab PT Goals Patient Stated Goal: to have less pain PT Goal Formulation: With patient Time For Goal Achievement: 08/26/16 Potential to Achieve Goals: Fair    Frequency Min 3X/week   Barriers to discharge         Co-evaluation               End of Session Equipment Utilized During Treatment: Gait belt;Back brace;Left knee immobilizer Activity Tolerance: Patient limited by pain;Treatment limited secondary to agitation Patient left: in bed;with call bell/phone within reach      Functional Assessment Tool Used: clinical judgement, mobility assessment Functional Limitation: Mobility: Walking and moving around Mobility: Walking and Moving Around Current Status ([id number]): At least 40 percent but less than 60 percent impaired, limited or restricted Mobility: Walking and Moving Around Goal Status [phone]): At least 20 percent but less than 40 percent impaired, limited or restricted    Time: 1520-1553 PT Time Calculation (min) (ACUTE ONLY): 33 min   Charges:   PT Evaluation $PT Eval Moderate Complexity: 1 Procedure PT Treatments $Therapeutic Activity: 8-22 mins   PT G Codes:   PT G-Codes **NOT FOR INPATIENT CLASS** Functional Assessment Tool Used: clinical judgement, mobility assessment Functional Limitation: Mobility: Walking and moving around Mobility: Walking and Moving Around Current Status (U9811): At least 40 percent but less than 60 percent impaired, limited or restricted Mobility: Walking and Moving Around Goal Status [phone]): At least 20 percent but less than 40 percent impaired, limited or restricted    Colin Broach PT, DPT  [phone]  08/19/2016, 4:08 PM

## 2016-08-19 NOTE — Consult Note (Signed)
Patient ID: Tami Ramirez MRN: [id number] DOB/AGE: [birth year] 83 y.o.  Admit date: 08/18/2016  Admission Diagnoses:  Principal Problem:   Left tibial fracture Active Problems:   S/P mitral valve repair   S/P Maze operation for atrial fibrillation   Essential hypertension, benign   Atrial fibrillation (HCC)   Chronic diastolic heart failure (HCC)   Ambulatory dysfunction   Closed T11 fracture (HCC)   Tibial fracture   HPI: Pleasant 83 year old pt with a past med hx of Afib, CHF, past CVA.  She was a resident of a SNF and unfortunately fell leaving her bathroom yesterday.  She denies hitting her head.  The pt reports severe back pain and severe left leg pain.  The pt says any movement increases her pain.  Past Medical History: Past Medical History:  Diagnosis Date  . ASCVD (arteriosclerotic cardiovascular disease)    SV,AWMI June 97, PTCA/stent LAD 1997, PTCA diagonal 08/07  . Atrial fibrillation (HCC)   . Chronic diastolic heart failure (HCC)   . Chronic kidney disease   . Equilibrium disorder    Chronic  . Essential hypertension, benign   . Exertional dyspnea    Chronic  . Fracture, tibial plateau 08/18/2016   left   . Osteoporosis   . Overweight   . S/P Maze operation for atrial fibrillation 10/07/2009  . S/P mitral valve repair 10/07/2009  . Stroke [facility](HCC)     Surgical History: Past Surgical History:  Procedure Laterality Date  . APPENDECTOMY    . [facility] Maze procedure and ligation of left femoral AV fistula  10/07/2009   Dr Cornelius Moraswen  . right miniature thoracotomy for mitral valve repair  10/07/2009   28-mm Sorin Memo 3-D ring annuloplasty  . TOTAL ABDOMINAL HYSTERECTOMY W/ BILATERAL SALPINGOOPHORECTOMY      Family History: Family History  Problem Relation Age of Onset  . Stroke Mother   . Stroke Father   . Bone cancer Brother     Social History: Social History   Social History  . Marital status: Widowed    Spouse name: N/A  . Number of children: N/A    . Years of education: N/A   Occupational History  . Not on file.   Social History Main Topics  . Smoking status: Never Smoker  . Smokeless tobacco: Never Used  . Alcohol use No  . Drug use: No  . Sexual activity: Not on file   Other Topics Concern  . Not on file   Social History Narrative  . No narrative on file    Allergies: Morphine sulfate; Penicillins; Spironolactone; Codeine; Darvocet [propoxyphene n-acetaminophen]; and Lisinopril  Medications: I have reviewed the patient's current medications.  Vital Signs: Patient Vitals for the past 24 hrs:  BP Temp Temp src Pulse Resp SpO2 Weight  08/19/16 0654 (!) 128/55 98.9 F (37.2 C) Oral (!) 102 16 97 % -  08/18/16 2049 (!) 150/56 98.9 F (37.2 C) Oral (!) 103 17 97 % -  08/18/16 2000 156/71 - - 107 - 97 % -  08/18/16 1944 - - - - - - 65.8 kg (145 lb)  08/18/16 1915 137/70 - - 104 16 96 % -  08/18/16 1900 143/60 - - 109 - 95 % -  08/18/16 1655 152/66 98.2 F (36.8 C) Oral 107 16 94 % -    Radiology: Dg Lumbar Spine Complete  Result Date: 08/18/2016 CLINICAL DATA:  Fall EXAM: LUMBAR SPINE - COMPLETE 4+ VIEW COMPARISON:  CT abdomen pelvis  09/21/2009 FINDINGS: Mild scoliosis of the spine. Lumbar alignment within normal limits. Vertebral body heights are grossly maintained. Minimal superior endplate concavity at T11. Moderate-to-marked narrowing at L2-L3 and L3-L4 with endplate changes and osteophyte. Atherosclerotic vascular calcifications IMPRESSION: 1. Minimal superior endplate concavity at T11 uncertain chronicity, correlate clinically for point tenderness 2. Otherwise no radiographic evidence for acute osseous abnormality Electronically Signed   By: Jasmine Pang M.D.   On: 08/18/2016 18:07   Ct Knee Left Wo Contrast  Result Date: 08/19/2016 CLINICAL DATA:  Left knee pain, fall in bathtub yesterday, lateral tibial plateau fracture. EXAM: CT OF THE left KNEE WITHOUT CONTRAST TECHNIQUE: Multidetector CT imaging of the  left knee was performed according to the standard protocol. Multiplanar CT image reconstructions were also generated. COMPARISON:  08/18/2016 FINDINGS: Bones/Joint/Cartilage Schatzker 2 lateral split-depressed fracture of the lateral tibial plateau. The central depressed component is displaced down about 7 mm laterally but not medially. The vertical fracture plane component is standard and extends into the tibial side of the proximal tibiofibular articulation. Small knee effusion.  Tricompartmental spurring. Ligaments Suboptimally assessed by CT. Muscles and Tendons Linear ossification in the patellar tendon with mild surrounding stranding. Spurring at the quadriceps attachment site. Soft tissues Subcutaneous edema inferolateral to the patella. IMPRESSION: 1. Schatzker 2 lateral split -depressed fracture of the lateral tibial plateau. Small knee effusion. 2. Tricompartmental spurring. Electronically Signed   By: Gaylyn Rong M.D.   On: 08/19/2016 08:13   Dg Knee Complete 4 Views Left  Result Date: 08/18/2016 CLINICAL DATA:  83 year old female with history of trauma from a fall while taking a shower this morning complaining of left knee pain. EXAM: LEFT KNEE - COMPLETE 4+ VIEW COMPARISON:  No priors. FINDINGS: There is an oblique intra-articular fracture through the lateral tibial plateau which appears nondisplaced. Small suprapatellar effusion. Femur and fibula appear intact. Overlying soft tissues are swollen. IMPRESSION: 1. Nondisplaced oblique intra-articular fracture through the lateral tibial plateau. Electronically Signed   By: Trudie Reed M.D.   On: 08/18/2016 18:06   Dg Femur Port Min 2 Views Left  Result Date: 08/18/2016 CLINICAL DATA:  Fall EXAM: LEFT FEMUR PORTABLE 2 VIEWS COMPARISON:  None. FINDINGS: Surgical clips at the left groin. The left femoral head projects in joint. No fracture or dislocation involving proximal left femur. Partially visualized lateral tibial plateau fracture.  IMPRESSION: 1. No acute osseous abnormality of the imaged portions of proximal left femur. 2. Partially visualized fracture of the lateral tibial plateau. Electronically Signed   By: Jasmine Pang M.D.   On: 08/18/2016 18:01    Labs:  Recent Labs  08/18/16 1845 08/19/16 0340  WBC 14.1* 10.9*  RBC 4.18 3.50*  HCT 32.7* 27.5*  PLT 234 222    Recent Labs  08/18/16 1845 08/19/16 0340  NA 142 142  K 4.1 4.3  CL 109 108  CO2 21* 27  BUN 15 14  CREATININE 0.93 0.93  GLUCOSE 119* 108*  CALCIUM 9.0 8.5*   No results for input(s): LABPT, INR in the last 72 hours.  Review of Systems: ROS  Physical Exam: Neurologically intact ABD soft Sensation intact distally Dorsiflexion/Plantar flexion intact Compartment soft Severe pain noted of LLE with movement  Assessment and Plan: Dr. Shon Baton consulted with Dr. Carola Frost who will manage pt Pt will remain NWB Knee Immobilizer in place Current plan is to manage pt non-operative Requested pain medication for pt while on floor  Anette Riedel, PAC for Venita Lick, MD [facility] [phone]

## 2016-08-20 ENCOUNTER — Observation Stay (HOSPITAL_COMMUNITY): Payer: PPO

## 2016-08-20 DIAGNOSIS — W182XXA Fall in (into) shower or empty bathtub, initial encounter: Secondary | ICD-10-CM | POA: Diagnosis not present

## 2016-08-20 DIAGNOSIS — S22080D Wedge compression fracture of T11-T12 vertebra, subsequent encounter for fracture with routine healing: Secondary | ICD-10-CM | POA: Diagnosis not present

## 2016-08-20 DIAGNOSIS — I2510 Atherosclerotic heart disease of native coronary artery without angina pectoris: Secondary | ICD-10-CM | POA: Diagnosis not present

## 2016-08-20 DIAGNOSIS — S22080A Wedge compression fracture of T11-T12 vertebra, initial encounter for closed fracture: Secondary | ICD-10-CM | POA: Diagnosis not present

## 2016-08-20 DIAGNOSIS — Z90710 Acquired absence of both cervix and uterus: Secondary | ICD-10-CM | POA: Diagnosis not present

## 2016-08-20 DIAGNOSIS — Z888 Allergy status to other drugs, medicaments and biological substances status: Secondary | ICD-10-CM | POA: Diagnosis not present

## 2016-08-20 DIAGNOSIS — Y92091 Bathroom in other non-institutional residence as the place of occurrence of the external cause: Secondary | ICD-10-CM | POA: Diagnosis not present

## 2016-08-20 DIAGNOSIS — R262 Difficulty in walking, not elsewhere classified: Secondary | ICD-10-CM | POA: Diagnosis not present

## 2016-08-20 DIAGNOSIS — J9811 Atelectasis: Secondary | ICD-10-CM | POA: Diagnosis not present

## 2016-08-20 DIAGNOSIS — S728X9A Other fracture of unspecified femur, initial encounter for closed fracture: Secondary | ICD-10-CM | POA: Diagnosis not present

## 2016-08-20 DIAGNOSIS — M810 Age-related osteoporosis without current pathological fracture: Secondary | ICD-10-CM | POA: Diagnosis not present

## 2016-08-20 DIAGNOSIS — I4891 Unspecified atrial fibrillation: Secondary | ICD-10-CM | POA: Diagnosis not present

## 2016-08-20 DIAGNOSIS — N189 Chronic kidney disease, unspecified: Secondary | ICD-10-CM | POA: Diagnosis not present

## 2016-08-20 DIAGNOSIS — Z880 Allergy status to penicillin: Secondary | ICD-10-CM | POA: Diagnosis not present

## 2016-08-20 DIAGNOSIS — I130 Hypertensive heart and chronic kidney disease with heart failure and stage 1 through stage 4 chronic kidney disease, or unspecified chronic kidney disease: Secondary | ICD-10-CM | POA: Diagnosis not present

## 2016-08-20 DIAGNOSIS — Z7901 Long term (current) use of anticoagulants: Secondary | ICD-10-CM | POA: Diagnosis not present

## 2016-08-20 DIAGNOSIS — I5032 Chronic diastolic (congestive) heart failure: Secondary | ICD-10-CM | POA: Diagnosis not present

## 2016-08-20 DIAGNOSIS — S82202D Unspecified fracture of shaft of left tibia, subsequent encounter for closed fracture with routine healing: Secondary | ICD-10-CM | POA: Diagnosis not present

## 2016-08-20 DIAGNOSIS — Z885 Allergy status to narcotic agent status: Secondary | ICD-10-CM | POA: Diagnosis not present

## 2016-08-20 DIAGNOSIS — I482 Chronic atrial fibrillation: Secondary | ICD-10-CM | POA: Diagnosis not present

## 2016-08-20 DIAGNOSIS — R531 Weakness: Secondary | ICD-10-CM | POA: Diagnosis not present

## 2016-08-20 DIAGNOSIS — W19XXXD Unspecified fall, subsequent encounter: Secondary | ICD-10-CM | POA: Diagnosis not present

## 2016-08-20 DIAGNOSIS — M25562 Pain in left knee: Secondary | ICD-10-CM | POA: Diagnosis not present

## 2016-08-20 DIAGNOSIS — D649 Anemia, unspecified: Secondary | ICD-10-CM | POA: Diagnosis not present

## 2016-08-20 DIAGNOSIS — I272 Pulmonary hypertension, unspecified: Secondary | ICD-10-CM | POA: Diagnosis not present

## 2016-08-20 DIAGNOSIS — M25462 Effusion, left knee: Secondary | ICD-10-CM | POA: Diagnosis not present

## 2016-08-20 DIAGNOSIS — S82142A Displaced bicondylar fracture of left tibia, initial encounter for closed fracture: Secondary | ICD-10-CM | POA: Diagnosis not present

## 2016-08-20 DIAGNOSIS — I10 Essential (primary) hypertension: Secondary | ICD-10-CM | POA: Diagnosis not present

## 2016-08-20 DIAGNOSIS — Z8673 Personal history of transient ischemic attack (TIA), and cerebral infarction without residual deficits: Secondary | ICD-10-CM | POA: Diagnosis not present

## 2016-08-20 DIAGNOSIS — S82102A Unspecified fracture of upper end of left tibia, initial encounter for closed fracture: Secondary | ICD-10-CM | POA: Diagnosis not present

## 2016-08-20 DIAGNOSIS — Z823 Family history of stroke: Secondary | ICD-10-CM | POA: Diagnosis not present

## 2016-08-20 DIAGNOSIS — S299XXA Unspecified injury of thorax, initial encounter: Secondary | ICD-10-CM | POA: Diagnosis not present

## 2016-08-20 DIAGNOSIS — Z79899 Other long term (current) drug therapy: Secondary | ICD-10-CM | POA: Diagnosis not present

## 2016-08-20 LAB — CBC
HEMATOCRIT: 27.3 % — AB (ref 36.0–46.0)
HEMOGLOBIN: 8.1 g/dL — AB (ref 12.0–15.0)
MCH: 23.6 pg — AB (ref 26.0–34.0)
MCHC: 29.7 g/dL — ABNORMAL LOW (ref 30.0–36.0)
MCV: 79.6 fL (ref 78.0–100.0)
PLATELETS: 211 10*3/uL (ref 150–400)
RBC: 3.43 MIL/uL — AB (ref 3.87–5.11)
RDW: 15.9 % — ABNORMAL HIGH (ref 11.5–15.5)
WBC: 12 10*3/uL — AB (ref 4.0–10.5)

## 2016-08-20 MED ORDER — DIPHENHYDRAMINE HCL 25 MG PO CAPS
25.0000 mg | ORAL_CAPSULE | Freq: Four times a day (QID) | ORAL | Status: DC | PRN
  Administered 2016-08-20: 25 mg via ORAL
  Filled 2016-08-20: qty 1

## 2016-08-20 MED ORDER — HYDROCODONE-ACETAMINOPHEN 5-325 MG PO TABS
1.0000 | ORAL_TABLET | ORAL | Status: DC | PRN
  Administered 2016-08-20 – 2016-08-21 (×3): 2 via ORAL
  Filled 2016-08-20 (×3): qty 2

## 2016-08-20 NOTE — [place] FL2 (Signed)
[place] MEDICAID FL2 LEVEL OF CARE SCREENING TOOL     IDENTIFICATION  Patient Name: Tami Ramirez Birthdate: 1928-12-01 Sex: female Admission Date (Current Location): 08/18/2016  [facility] and [place] Number:  Producer, television/film/video and Address:  [facility], [address], [place], [place] [address]      Provider Number: [id number]  Attending Physician Name and Address:  Pearson Grippe, MD  Relative Name and Phone Number:       Current Level of Care: Hospital Recommended Level of Care: Skilled Nursing Facility Prior Approval Number:    Date Approved/Denied:   PASRR Number:   [id number]   Discharge Plan: SNF    Current Diagnoses: Patient Active Problem List   Diagnosis Date Noted  . Ambulatory dysfunction 08/18/2016  . Left tibial fracture 08/18/2016  . Closed wedge compression fracture of T11 vertebra (HCC) 08/18/2016  . Tibial fracture 08/18/2016  . TIA (transient ischemic attack) 11/18/2014  . Coronary atherosclerosis of native coronary artery 04/21/2013  . Pure hypercholesterolemia 04/21/2013  . Essential hypertension, benign 04/21/2013  . Mitral valve disorder 04/21/2013  . Atrial fibrillation (HCC) 04/21/2013  . Chronic diastolic heart failure (HCC) 04/21/2013  . Unspecified late effects of cerebrovascular disease 04/21/2013  . S/P mitral valve repair 10/07/2009  . S/P Maze operation for atrial fibrillation 10/07/2009    Orientation RESPIRATION BLADDER Height & Weight     Self, Time, Place, Situation  O2 (2L) Continent Weight: 145 lb (65.8 kg) Height:     BEHAVIORAL SYMPTOMS/MOOD NEUROLOGICAL BOWEL NUTRITION STATUS      Continent Diet (Heart Healthy)  AMBULATORY STATUS COMMUNICATION OF NEEDS Skin   Limited Assist Verbally Normal                       Personal Care Assistance Level of Assistance  Bathing, Feeding, Dressing Bathing Assistance: Limited assistance Feeding assistance: Independent Dressing Assistance: Limited  assistance     Functional Limitations Info             SPECIAL CARE FACTORS FREQUENCY  PT (By licensed PT), OT (By licensed OT)     PT Frequency: 5 OT Frequency: 5            Contractures      Additional Factors Info  Code Status, Allergies Code Status Info: Full Code Allergies Info: MORPHINE SULFATE, PENICILLINS, SPIRONOLACTONE, CODEINE, DARVOCET PROPOXYPHENE N-ACETAMINOPHEN, LISINOPRIL            Current Medications (08/20/2016):  This is the current hospital active medication list Current Facility-Administered Medications  Medication Dose Route Frequency Provider Last Rate Last Dose  . acetaminophen (TYLENOL) tablet 650 mg  650 mg Oral Q6H PRN Eduard Clos, MD   650 mg at 08/20/16 0901   Or  . acetaminophen (TYLENOL) suppository 650 mg  650 mg Rectal Q6H PRN Eduard Clos, MD      . albuterol (PROVENTIL) (2.5 MG/3ML) 0.083% nebulizer solution 2.5 mg  2.5 mg Nebulization Q6H PRN Eduard Clos, MD      . amLODipine (NORVASC) tablet 10 mg  10 mg Oral Daily Eduard Clos, MD   10 mg at 08/19/16 1046  . apixaban (ELIQUIS) tablet 5 mg  5 mg Oral BID Eduard Clos, MD   5 mg at 08/20/16 0915  . fentaNYL (SUBLIMAZE) injection 25 mcg  25 mcg Intravenous Q2H PRN Eduard Clos, MD   25 mcg at 08/19/16 0930  . furosemide (LASIX) tablet 40  mg  40 mg Oral BID Eduard ClosArshad N Kakrakandy, MD   40 mg at 08/20/16 0914  . HYDROcodone-acetaminophen (NORCO/VICODIN) 5-325 MG per tablet 1-2 tablet  1-2 tablet Oral Q4H PRN Samson FredericBrian Swinteck, MD   2 tablet at 08/20/16 1017  . losartan (COZAAR) tablet 100 mg  100 mg Oral Daily Eduard ClosArshad N Kakrakandy, MD   100 mg at 08/19/16 1046  . multivitamin with minerals tablet 1 tablet  1 tablet Oral Daily Eduard ClosArshad N Kakrakandy, MD   1 tablet at 08/20/16 0904  . nitroGLYCERIN (NITROSTAT) SL tablet 0.4 mg  0.4 mg Sublingual Q5 min PRN Eduard ClosArshad N Kakrakandy, MD      . ondansetron [facility](ZOFRAN) tablet 4 mg  4 mg Oral Q6H PRN Eduard ClosArshad N  Kakrakandy, MD       Or  . ondansetron [facility](ZOFRAN) injection 4 mg  4 mg Intravenous Q6H PRN Eduard ClosArshad N Kakrakandy, MD   4 mg at 08/19/16 0656  . ondansetron (ZOFRAN-ODT) disintegrating tablet 4 mg  4 mg Oral Q8H PRN Eduard ClosArshad N Kakrakandy, MD      . potassium chloride (K-DUR,KLOR-CON) CR tablet 10 mEq  10 mEq Oral Daily Eduard ClosArshad N Kakrakandy, MD   10 mEq at 08/20/16 0903  . pravastatin (PRAVACHOL) tablet 40 mg  40 mg Oral Daily Eduard ClosArshad N Kakrakandy, MD   40 mg at 08/20/16 [id number]     Discharge Medications: Please see discharge summary for a list of discharge medications.  Relevant Imaging Results:  Relevant Lab Results:   Additional Information SS#: [id number]  Donnie CoffinErin M Viyaan Champine, LCSW

## 2016-08-20 NOTE — Discharge Instructions (Signed)
Information on my medicine - ELIQUIS® (apixaban) ° °This medication education was reviewed with me or my healthcare representative as part of my discharge preparation.  The pharmacist that spoke with me during my hospital stay was:  Talasia Saulter Brown, RPH ° °Why was Eliquis® prescribed for you? °Eliquis® was prescribed for you to reduce the risk of forming blood clots that can cause a stroke if you have a medical condition called atrial fibrillation (a type of irregular heartbeat) OR to reduce the risk of a blood clots forming after orthopedic surgery. ° °What do You need to know about Eliquis® ? °Take your Eliquis® TWICE DAILY - one tablet in the morning and one tablet in the evening with or without food.  It would be best to take the doses about the same time each day. ° °If you have difficulty swallowing the tablet whole please discuss with your pharmacist how to take the medication safely. ° °Take Eliquis® exactly as prescribed by your doctor and DO NOT stop taking Eliquis® without talking to the doctor who prescribed the medication.  Stopping may increase your risk of developing a new clot or stroke.  Refill your prescription before you run out. ° °After discharge, you should have regular check-up appointments with your healthcare provider that is prescribing your Eliquis®.  In the future your dose may need to be changed if your kidney function or weight changes by a significant amount or as you get older. ° °What do you do if you miss a dose? °If you miss a dose, take it as soon as you remember on the same day and resume taking twice daily.  Do not take more than one dose of ELIQUIS at the same time. ° °Important Safety Information °A possible side effect of Eliquis® is bleeding. You should call your healthcare provider right away if you experience any of the following: °? Bleeding from an injury or your nose that does not stop. °? Unusual colored urine (red or dark brown) or unusual colored stools (red or  black). °? Unusual bruising for unknown reasons. °? A serious fall or if you hit your head (even if there is no bleeding). ° °Some medicines may interact with Eliquis® and might increase your risk of bleeding or clotting while on Eliquis®. To help avoid this, consult your healthcare provider or pharmacist prior to using any new prescription or non-prescription medications, including herbals, vitamins, non-steroidal anti-inflammatory drugs (NSAIDs) and supplements. ° °This website has more information on Eliquis® (apixaban): [email or web address].com. ° ° ° °

## 2016-08-20 NOTE — Progress Notes (Addendum)
   Subjective: Patient reports pain as moderate to severe.   Objective:   VITALS:   Vitals:   08/19/16 1228 08/19/16 2009 08/20/16 0413 08/20/16 0900  BP: (!) 133/58 (!) 130/56 (!) 124/55   Pulse: (!) 101 88 87   Resp: 16 16 18    Temp: 99.7 F (37.6 C) 99.4 F (37.4 C) 99 F (37.2 C)   TempSrc: Oral Oral Oral   SpO2: 92% 93% 95% 92%  Weight:       NAD LLE: KI intact. Compartments soft and compressible. (+) TA/GS/EHL. No pain with passive stretch. SILT. 2+ DP.  Lab Results  Component Value Date   WBC 12.0 (H) 08/20/2016   HGB 8.1 (L) 08/20/2016   HCT 27.3 (L) 08/20/2016   MCV 79.6 08/20/2016   PLT 211 08/20/2016   BMET    Component Value Date/Time   NA 142 08/19/2016 0340   K 4.3 08/19/2016 0340   CL 108 08/19/2016 0340   CO2 27 08/19/2016 0340   GLUCOSE 108 (H) 08/19/2016 0340   BUN 14 08/19/2016 0340   CREATININE 0.93 08/19/2016 0340   CREATININE 0.71 05/22/2015 0930   CALCIUM 8.5 (L) 08/19/2016 0340   GFRNONAA 54 (L) 08/19/2016 0340   GFRAA >60 08/19/2016 0340     Assessment/Plan:     Principal Problem:   Left tibial fracture Active Problems:   S/P mitral valve repair   S/P Maze operation for atrial fibrillation   Essential hypertension, benign   Atrial fibrillation (HCC)   Chronic diastolic heart failure (HCC)   Ambulatory dysfunction   Closed wedge compression fracture of T11 vertebra (HCC)   Tibial fracture   Assessment and Plan: Plan for nonoperative treatment NWB LLE Bledsoe brace at all times, ok for ROM of left knee PT/OT Added norco for pain control F/U with Dr. Carola FrostHandy 1 week after discharge    Jeremaih Klima, Cloyde ReamsBrian James 08/20/2016, 9:04 AM   Samson FredericBrian Adia Crammer, MD Cell [phone]

## 2016-08-20 NOTE — Clinical Social Work Note (Signed)
Clinical Social Work Assessment  Patient Details  Name: Tami Ramirez MRN: [id number] Date of Birth: 11/28/1928  Date of referral:  08/20/16               Reason for consult:  Facility Placement                Permission sought to share information with:  Family Supports Permission granted to share information::  Yes, Verbal Permission Granted  Name::     Misty StanleyLisa  Agency::     Relationship::  Daughter  Contact Information:  [phone]  Housing/Transportation Living arrangements for the past 2 months:  Skilled Nursing Facility Source of Information:  Adult Children Patient Interpreter Needed:  None Criminal Activity/Legal Involvement Pertinent to Current Situation/Hospitalization:  No - Comment as needed Significant Relationships:  Adult Children Lives with:  Self Do you feel safe going back to the place where you live?    Need for family participation in patient care:     Care giving concerns:  Pt's daughter present at bedside during initial assessment.    Social Worker assessment / plan:  CSW spoke with pt and pt's daughter at bedside to complete initial assessment. Pt fell asleep shortly after CSW entered the room. Pt reports she is the POA. Pt daughter reports pt has Medicare pt A&B however, it does not reflect that in pt's chart. CSW wrote down number on pt's medicare card. Pt's daughter is agreeable to SNF placement for her mom and reports they prefer [facility]. CSW will send referal and follow up with daughter once bed offers available.   Employment status:  Retired Database administratornsurance information:  Managed Medicare PT Recommendations:  Skilled Nursing Facility Information / Referral to community resources:  Skilled Nursing Facility  Patient/Family's Response to care:  Pt verbalized understanding of CSW role and expressed appreciation for support. Pt's daughter denies any concern regarding pt care at this time.  Patient/Family's Understanding of and Emotional Response to  Diagnosis, Current Treatment, and Prognosis:  Pt's daughter understanding of pt's physical limitations and is agreeable to SNF placement at d/c. Pt's daughter denies any concern regarding pt's treatment plan at this time. CSW will continue to provide support.   Emotional Assessment Appearance:  Appears stated age Attitude/Demeanor/Rapport:   (Patient was appropriate.) Affect (typically observed):  Accepting, Appropriate Orientation:  Oriented to Self, Oriented to Place, Oriented to  Time, Oriented to Situation Alcohol / Substance use:  Not Applicable Psych involvement (Current and /or in the community):     Discharge Needs  Concerns to be addressed:  No discharge needs identified Readmission within the last 30 days:  No Current discharge risk:  Dependent with Mobility Barriers to Discharge:  Continued Medical Work up   Safeway IncBridget A Mayton, LCSW 08/20/2016, 3:54 PM

## 2016-08-20 NOTE — Clinical Social Work Placement (Signed)
   CLINICAL SOCIAL WORK PLACEMENT  NOTE  Date:  08/20/2016  Patient Details  Name: Gentry RochLouise W Weldon MRN: [id number] Date of Birth: 11-05-1928  Clinical Social Work is seeking post-discharge placement for this patient at the Skilled  Nursing Facility level of care (*CSW will initial, date and re-position this form in  chart as items are completed):      Patient/family provided with [facility] Clinical Social Work Department's list of facilities offering this level of care within the geographic area requested by the patient (or if unable, by the patient's family).      Patient/family informed of their freedom to choose among providers that offer the needed level of care, that participate in Medicare, Medicaid or managed care program needed by the patient, have an available bed and are willing to accept the patient.      Patient/family informed of [facility]'s ownership interest in [facility] and [facility], as well as of the fact that they are under no obligation to receive care at these facilities.  PASRR submitted to EDS on       PASRR number received on 08/20/16     Existing PASRR number confirmed on       FL2 transmitted to all facilities in geographic area requested by pt/family on 08/20/16     FL2 transmitted to all facilities within larger geographic area on       Patient informed that his/her managed care company has contracts with or will negotiate with certain facilities, including the following:        Yes   Patient/family informed of bed offers received.  Patient chooses bed at       Physician recommends and patient chooses bed at      Patient to be transferred to   on  .  Patient to be transferred to facility by       Patient family notified on   of transfer.  Name of family member notified:        PHYSICIAN Please prepare priority discharge summary, including medications, Please prepare prescriptions, Please sign FL2     Additional Comment:     _______________________________________________ Volney AmericanBridget A Mayton, LCSW 08/20/2016, 3:58 PM

## 2016-08-20 NOTE — Progress Notes (Signed)
Orthopedic Tech Progress Note Patient Details:  Gentry RochLouise W Kwiatek [birth year] [id number] Brace completed by bio-tech Patient ID: Gentry RochLouise W Spillers, female   DOB: [birth year], 83 y.o.   MRN: [id number]   Jennye MoccasinHughes, Keahi Mccarney Craig 08/20/2016, 1:14 PM

## 2016-08-20 NOTE — Progress Notes (Signed)
Patient ID: Tami Ramirez, female   DOB: [birth year], 83 y.o.   MRN: [id number]                                                                PROGRESS NOTE                                                                                                                                                                                                             Patient Demographics:    Tami Ramirez, is a 83 y.o. female, DOB - [birth year], WUJ:[id number]  Admit date - 08/18/2016   Admitting Physician Eduard Clos, MD  Outpatient Primary MD for the patient is Lillia Mountain, MD  LOS - 0  Outpatient Specialists:    Chief Complaint  Patient presents with  . Fall  . Knee Pain       Brief Narrative   83 y.o. female with history of atrial fibrillation, diastolic CHF, history of stroke, hypertension was brought to the ER after patient had a fall. Patient usually lives at home but due to change in weather condition and snow patient was temporarily placed in assisted living facility. After taking a shower patient stepped out of the bathroom when patient slipped and fell. Denies hitting her head or losing consciousness. Due to persistent pain in the low back and left knee patient was brought to the ER and x-rays revealed left tibial lateral fracture and T11 compression fracture. Patient is finding it difficult to ambulate due to pain. Patient is being admitted for further management.   ED Course: X-rays revealed left tibial plateau fracture and T11 compression fracture.     Subjective:    Tami Ramirez today is doing well.  Per ortho nonoperative.  Pt denies complaints.   No headache, No chest pain, No abdominal pain - No Nausea, No new weakness tingling or numbness, No Cough - SOB.    Assessment  & Plan :    Principal Problem:   Left tibial fracture Active Problems:   S/P mitral valve repair   S/P Maze operation for atrial fibrillation   Essential hypertension, benign   Atrial  fibrillation (HCC)   Chronic diastolic heart failure (HCC)   Ambulatory dysfunction   Closed wedge compression fracture of T11 vertebra (HCC)   Tibial fracture  Left tibial fracture. CT: Schatzker 2 lateral split -depressed fracture of the lateral tibial plateau. Small knee effusion -cont pain control, appreciate orthopedic recommendations,  Per PT SNF recommended.  Social work has been consulted and should be working on placement.   T11 compression fracture status post mechanical fall Dr. Mikal Planeabell, on-call neurosurgeon was consulted by ER physician. Neurosurgeon has advised T LSO brace and follow-up with him in one week.  -cont pain control, exam is non focal. awaiting MRI of the T-spine.  Chronic atrial fibrillation - on Apixaban. Chads 2 vasc score is more than 2.  Chronic diastolic CHF, pulmonary HTN. History of mitral valve repair and maze procedure. -clinically euvolemic. - continue Lasix and potassium.  Hypertension on Cozaar and amlodipine.  History of CVA- on statins and apixaban.  Chronic anemiawith mild worsening of hemoglobin, but no s/s of active bleeding. Monitor Hg Check cbc in am  Leukocytosis. Afebrile. No respiratory symptoms.  UA pending     Code Status : FULL CODE  Family Communication  :   Disposition Plan  : SNF per PT, social work consult in place  Barriers For Discharge :   Consults  :  Orthopedics  Procedures  :   DVT Prophylaxis  :  Apixaban  Lab Results  Component Value Date   PLT 211 08/20/2016    Antibiotics  :    Anti-infectives    None        Objective:   Vitals:   08/19/16 0654 08/19/16 1228 08/19/16 2009 08/20/16 0413  BP: (!) 128/55 (!) 133/58 (!) 130/56 (!) 124/55  Pulse: (!) 102 (!) 101 88 87  Resp: 16 16 16 18   Temp: 98.9 F (37.2 C) 99.7 F (37.6 C) 99.4 F (37.4 C) 99 F (37.2 C)  TempSrc: Oral Oral Oral Oral  SpO2: 97% 92% 93% 95%  Weight:        Wt Readings from Last 3 Encounters:  08/18/16 65.8  kg (145 lb)  06/03/16 63.7 kg (140 lb 8 oz)  11/27/15 64.8 kg (142 lb 12.8 oz)     Intake/Output Summary (Last 24 hours) at 08/20/16 16100623 Last data filed at 08/19/16 0900  Gross per 24 hour  Intake              120 ml  Output                0 ml  Net              120 ml     Physical Exam  Awake Alert, Oriented X 3, No new F.N deficits, Normal affect [place].AT,PERRAL Supple Neck,No JVD, No cervical lymphadenopathy appriciated.  Symmetrical Chest wall movement, Good air movement bilaterally, CTAB RRR,No Gallops,Rubs or new Murmurs, No Parasternal Heave +ve B.Sounds, Abd Soft, No tenderness, No organomegaly appriciated, No rebound - guarding or rigidity. No Cyanosis, Clubbing or edema, No new Rash or bruise  In L knee imobilizer    Data Review:    CBC  Recent Labs Lab 08/18/16 1845 08/19/16 0340 08/20/16 0340  WBC 14.1* 10.9* 12.0*  HGB 9.9* 8.3* 8.1*  HCT 32.7* 27.5* 27.3*  PLT 234 222 211  MCV 78.2 78.6 79.6  MCH 23.7* 23.7* 23.6*  MCHC 30.3 30.2 29.7*  RDW 16.0* 16.0* 15.9*  LYMPHSABS 1.8  --   --   MONOABS 1.0  --   --   EOSABS 0.0  --   --   BASOSABS 0.0  --   --  Chemistries   Recent Labs Lab 08/18/16 1845 08/19/16 0340  NA 142 142  K 4.1 4.3  CL 109 108  CO2 21* 27  GLUCOSE 119* 108*  BUN 15 14  CREATININE 0.93 0.93  CALCIUM 9.0 8.5*   ------------------------------------------------------------------------------------------------------------------ No results for input(s): CHOL, HDL, LDLCALC, TRIG, CHOLHDL, LDLDIRECT in the last 72 hours.  Lab Results  Component Value Date   HGBA1C 5.5 11/18/2014   ------------------------------------------------------------------------------------------------------------------ No results for input(s): TSH, T4TOTAL, T3FREE, THYROIDAB in the last 72 hours.  Invalid input(s): FREET3 ------------------------------------------------------------------------------------------------------------------ No  results for input(s): VITAMINB12, FOLATE, FERRITIN, TIBC, IRON, RETICCTPCT in the last 72 hours.  Coagulation profile No results for input(s): INR, PROTIME in the last 168 hours.  No results for input(s): DDIMER in the last 72 hours.  Cardiac Enzymes No results for input(s): CKMB, TROPONINI, MYOGLOBIN in the last 168 hours.  Invalid input(s): CK ------------------------------------------------------------------------------------------------------------------    Component Value Date/Time   BNP 432.5 (H) 11/18/2014 0600    Inpatient Medications  Scheduled Meds: . amLODipine  10 mg Oral Daily  . apixaban  5 mg Oral BID  . furosemide  40 mg Oral BID  . losartan  100 mg Oral Daily  . multivitamin with minerals  1 tablet Oral Daily  . potassium chloride SA  10 mEq Oral Daily  . pravastatin  40 mg Oral Daily   Continuous Infusions: PRN Meds:.acetaminophen **OR** acetaminophen, albuterol, fentaNYL (SUBLIMAZE) injection, nitroGLYCERIN, ondansetron **OR** ondansetron (ZOFRAN) IV, ondansetron  Micro Results No results found for this or any previous visit (from the past 240 hour(s)).  Radiology Reports Dg Lumbar Spine Complete  Result Date: 08/18/2016 CLINICAL DATA:  Fall EXAM: LUMBAR SPINE - COMPLETE 4+ VIEW COMPARISON:  CT abdomen pelvis 09/21/2009 FINDINGS: Mild scoliosis of the spine. Lumbar alignment within normal limits. Vertebral body heights are grossly maintained. Minimal superior endplate concavity at T11. Moderate-to-marked narrowing at L2-L3 and L3-L4 with endplate changes and osteophyte. Atherosclerotic vascular calcifications IMPRESSION: 1. Minimal superior endplate concavity at T11 uncertain chronicity, correlate clinically for point tenderness 2. Otherwise no radiographic evidence for acute osseous abnormality Electronically Signed   By: Jasmine Pang M.D.   On: 08/18/2016 18:07   Ct Knee Left Wo Contrast  Result Date: 08/19/2016 CLINICAL DATA:  Left knee pain, fall in  bathtub yesterday, lateral tibial plateau fracture. EXAM: CT OF THE left KNEE WITHOUT CONTRAST TECHNIQUE: Multidetector CT imaging of the left knee was performed according to the standard protocol. Multiplanar CT image reconstructions were also generated. COMPARISON:  08/18/2016 FINDINGS: Bones/Joint/Cartilage Schatzker 2 lateral split-depressed fracture of the lateral tibial plateau. The central depressed component is displaced down about 7 mm laterally but not medially. The vertical fracture plane component is standard and extends into the tibial side of the proximal tibiofibular articulation. Small knee effusion.  Tricompartmental spurring. Ligaments Suboptimally assessed by CT. Muscles and Tendons Linear ossification in the patellar tendon with mild surrounding stranding. Spurring at the quadriceps attachment site. Soft tissues Subcutaneous edema inferolateral to the patella. IMPRESSION: 1. Schatzker 2 lateral split -depressed fracture of the lateral tibial plateau. Small knee effusion. 2. Tricompartmental spurring. Electronically Signed   By: Gaylyn Rong M.D.   On: 08/19/2016 08:13   Dg Knee Complete 4 Views Left  Result Date: 08/18/2016 CLINICAL DATA:  83 year old female with history of trauma from a fall while taking a shower this morning complaining of left knee pain. EXAM: LEFT KNEE - COMPLETE 4+ VIEW COMPARISON:  No priors. FINDINGS: There is an oblique intra-articular fracture  through the lateral tibial plateau which appears nondisplaced. Small suprapatellar effusion. Femur and fibula appear intact. Overlying soft tissues are swollen. IMPRESSION: 1. Nondisplaced oblique intra-articular fracture through the lateral tibial plateau. Electronically Signed   By: Trudie Reed M.D.   On: 08/18/2016 18:06   Dg Femur Port Min 2 Views Left  Result Date: 08/18/2016 CLINICAL DATA:  Fall EXAM: LEFT FEMUR PORTABLE 2 VIEWS COMPARISON:  None. FINDINGS: Surgical clips at the left groin. The left femoral  head projects in joint. No fracture or dislocation involving proximal left femur. Partially visualized lateral tibial plateau fracture. IMPRESSION: 1. No acute osseous abnormality of the imaged portions of proximal left femur. 2. Partially visualized fracture of the lateral tibial plateau. Electronically Signed   By: Jasmine Pang M.D.   On: 08/18/2016 18:01    Time Spent in minutes  30   Pearson Grippe M.D on 08/20/2016 at 6:23 AM  Between 7am to 7pm - Pager - [phone]  After 7pm go to [email or web address] - password [facility]  [facility] -  Office  [phone]

## 2016-08-21 LAB — COMPREHENSIVE METABOLIC PANEL
ALT: 10 U/L — AB (ref 14–54)
AST: 15 U/L (ref 15–41)
Albumin: 2.5 g/dL — ABNORMAL LOW (ref 3.5–5.0)
Alkaline Phosphatase: 63 U/L (ref 38–126)
Anion gap: 6 (ref 5–15)
BUN: 21 mg/dL — ABNORMAL HIGH (ref 6–20)
CHLORIDE: 106 mmol/L (ref 101–111)
CO2: 30 mmol/L (ref 22–32)
CREATININE: 1.09 mg/dL — AB (ref 0.44–1.00)
Calcium: 8.4 mg/dL — ABNORMAL LOW (ref 8.9–10.3)
GFR calc Af Amer: 52 mL/min — ABNORMAL LOW (ref >60)
GFR calc non Af Amer: 45 mL/min — ABNORMAL LOW (ref >60)
GLUCOSE: 99 mg/dL (ref 65–99)
Potassium: 4.7 mmol/L (ref 3.5–5.1)
SODIUM: 142 mmol/L (ref 135–145)
Total Bilirubin: 0.5 mg/dL (ref 0.3–1.2)
Total Protein: 5.5 g/dL — ABNORMAL LOW (ref 6.5–8.1)

## 2016-08-21 LAB — CBC
HCT: 27.4 % — ABNORMAL LOW (ref 36.0–46.0)
Hemoglobin: 8.2 g/dL — ABNORMAL LOW (ref 12.0–15.0)
MCH: 23.8 pg — ABNORMAL LOW (ref 26.0–34.0)
MCHC: 29.9 g/dL — ABNORMAL LOW (ref 30.0–36.0)
MCV: 79.7 fL (ref 78.0–100.0)
PLATELETS: 225 10*3/uL (ref 150–400)
RBC: 3.44 MIL/uL — AB (ref 3.87–5.11)
RDW: 15.8 % — ABNORMAL HIGH (ref 11.5–15.5)
WBC: 10.1 10*3/uL (ref 4.0–10.5)

## 2016-08-21 MED ORDER — TRAMADOL HCL 50 MG PO TABS
50.0000 mg | ORAL_TABLET | Freq: Four times a day (QID) | ORAL | Status: DC | PRN
  Administered 2016-08-21 – 2016-08-22 (×3): 50 mg via ORAL
  Filled 2016-08-21 (×3): qty 1

## 2016-08-21 NOTE — Clinical Social Work Note (Signed)
CSW has submitted for [employer] at this time. Pt's daughter request that pt be sent to [facility] [place]. CSW spoke with weekend admission coordinator [facility], per this staff member she does not have access to the hub. Staff member at [facility] left a note for Carroll SageBrenda Harris, admissions director to review pt's referal on hub. Possible bed at [facility] [place] on Monday 1/22. Possible insurance authorization on 1/22.  [address]Bridget Ramirez, ConnecticutLCSWA [phone]

## 2016-08-21 NOTE — Plan of Care (Signed)
Problem: Safety: Goal: Ability to remain free from injury will improve Outcome: Progressing Safety precautions and fall preventions maintained, no fall or injury noted this shift  Problem: Pain Managment: Goal: General experience of comfort will improve Outcome: Progressing Medicated once for pain with moderate relief, resting in the bed with eyes closed at present time, refused pain medication at this moment  Problem: Physical Regulation: Goal: Will remain free from infection Outcome: Progressing No S/S of infection noted, VS WNL  Problem: Skin Integrity: Goal: Risk for impaired skin integrity will decrease Outcome: Progressing No skin issues noted except fo a bruise to the left inner thigh  Problem: Tissue Perfusion: Goal: Risk factors for ineffective tissue perfusion will decrease Outcome: Progressing No S/S of DVT noted, patient is on Eliquis for DVT prophylatif  Problem: Activity: Goal: Risk for activity intolerance will decrease Outcome: Not Progressing Limited bed mobility due to pain  Problem: Bowel/Gastric: Goal: Will not experience complications related to bowel motility Outcome: Progressing No gastric or bowel issues reported, last BM on 08/19/2016

## 2016-08-21 NOTE — Progress Notes (Signed)
Patient ID: Tami Ramirez, female   DOB: [birth year], 83 y.o.   MRN: [id number]                                                                PROGRESS NOTE                                                                                                                                                                                                             Patient Demographics:    Tami Ramirez, is a 83 y.o. female, DOB - 1929-07-11, WUJ:[id number]  Admit date - 08/18/2016   Admitting Physician Eduard Clos, MD  Outpatient Primary MD for the patient is Lillia Mountain, MD  LOS - 1  Outpatient Specialists:   Chief Complaint  Patient presents with  . Fall  . Knee Pain       Brief Narrative   83 y.o.femalewith history of atrial fibrillation, diastolic CHF, history of stroke, hypertension was brought to the ER after patient had a fall. Patient usually lives at home but due to change in weather condition and snow patient was temporarily placed in assisted living facility. After taking ashower patient stepped out of the bathroom when patient slipped and fell. Denies hitting her head or losing consciousness. Due to persistent pain in the low back and left knee patient was brought to the ER and x-rays revealed left tibial lateral fracture and T11 compression fracture. Patient is finding it difficult to ambulate due to pain. Patient is being admitted for further management.  ED Course:X-rays revealed left tibial plateau fracture and T11 compression fracture.    Subjective:    Tami Ramirez today has continued left knee pain which she feels is controlled with current pain medication.  Possibly slight itching w norco.    Denies cp, palp, sob, lower ext edema.  Social work is working on placement at [facility] but apparently their admission coordinator is not in today.     Assessment  & Plan :    Principal Problem:   Left tibial fracture Active Problems:   S/P mitral valve  repair   S/P Maze operation for atrial fibrillation   Essential hypertension, benign   Atrial fibrillation (HCC)   Chronic diastolic heart failure (HCC)   Ambulatory dysfunction   Closed wedge compression fracture  of T11 vertebra (HCC)   Tibial fracture   Left tibial fracture. CT: Schatzker 2 lateral split -depressed fracture of the lateral tibial plateau. Small knee effusion -cont pain control,  D/c norco due to itching Try tramadol 50mg  po q6h prn appreciate orthopedic recommendations,  Per PT SNF recommended.  Social work has been consulted and is working on placement.   T11 compression fracture status post mechanical fallDr. Mikal Planeabell, on-call neurosurgeon was consulted by ER physician. Neurosurgeon has advised T LSO brace and follow-up with him in one week.  -cont pain control, exam is non focal.  MRI negative for compression fracture  Chronic atrial fibrillation - on Apixaban. Chads 2 vasc score is more than 2.  Chronic diastolic CHF, pulmonary HTN.History of mitral valve repair and maze procedure. -clinically euvolemic. - continue Lasix and potassium.  Hypertension on Cozaar and amlodipine.  History of CVA- on statins and apixaban.  Chronic anemiawith mild worsening of hemoglobin, but no s/s of active bleeding. Monitor Hg Check cbc in am    Code Status : FULL CODE  Family Communication  :   Disposition Plan  : SNF per PT, social work consult in place  Barriers For Discharge :   Consults  :  Orthopedics  Procedures  :   DVT Prophylaxis  :  Apixaban  Lab Results  Component Value Date   PLT 225 08/21/2016    Antibiotics  :    Anti-infectives    None        Objective:   Vitals:   08/20/16 0900 08/20/16 1500 08/20/16 2107 08/21/16 0541  BP: (!) 124/52 (!) 124/53 (!) 133/51 (!) 133/51  Pulse: 87 86 79 75  Resp:  18 18 18   Temp:  98.8 F (37.1 C) 99.3 F (37.4 C) 98.9 F (37.2 C)  TempSrc:  Oral Oral Oral  SpO2: 92% 93% 95%  95%  Weight:        Wt Readings from Last 3 Encounters:  08/18/16 65.8 kg (145 lb)  06/03/16 63.7 kg (140 lb 8 oz)  11/27/15 64.8 kg (142 lb 12.8 oz)     Intake/Output Summary (Last 24 hours) at 08/21/16 0919 Last data filed at 08/20/16 1700  Gross per 24 hour  Intake              240 ml  Output                0 ml  Net              240 ml     Physical Exam  Awake Alert, Oriented X 3, No new F.N deficits, Normal affect [place].AT,PERRAL Supple Neck,No JVD, No cervical lymphadenopathy appriciated.  Symmetrical Chest wall movement, Good air movement bilaterally, CTAB RRR,No Gallops,Rubs or new Murmurs, No Parasternal Heave +ve B.Sounds, Abd Soft, No tenderness, No organomegaly appriciated, No rebound - guarding or rigidity. No Cyanosis, Clubbing or edema, No new Rash or bruise      Data Review:    CBC  Recent Labs Lab 08/18/16 1845 08/19/16 0340 08/20/16 0340 08/21/16 0520  WBC 14.1* 10.9* 12.0* 10.1  HGB 9.9* 8.3* 8.1* 8.2*  HCT 32.7* 27.5* 27.3* 27.4*  PLT 234 222 211 225  MCV 78.2 78.6 79.6 79.7  MCH 23.7* 23.7* 23.6* 23.8*  MCHC 30.3 30.2 29.7* 29.9*  RDW 16.0* 16.0* 15.9* 15.8*  LYMPHSABS 1.8  --   --   --   MONOABS 1.0  --   --   --  EOSABS 0.0  --   --   --   BASOSABS 0.0  --   --   --     Chemistries   Recent Labs Lab 08/18/16 1845 08/19/16 0340 08/21/16 0520  NA 142 142 142  K 4.1 4.3 4.7  CL 109 108 106  CO2 21* 27 30  GLUCOSE 119* 108* 99  BUN 15 14 21*  CREATININE 0.93 0.93 1.09*  CALCIUM 9.0 8.5* 8.4*  AST  --   --  15  ALT  --   --  10*  ALKPHOS  --   --  63  BILITOT  --   --  0.5   ------------------------------------------------------------------------------------------------------------------ No results for input(s): CHOL, HDL, LDLCALC, TRIG, CHOLHDL, LDLDIRECT in the last 72 hours.  Lab Results  Component Value Date   HGBA1C 5.5 11/18/2014    ------------------------------------------------------------------------------------------------------------------ No results for input(s): TSH, T4TOTAL, T3FREE, THYROIDAB in the last 72 hours.  Invalid input(s): FREET3 ------------------------------------------------------------------------------------------------------------------ No results for input(s): VITAMINB12, FOLATE, FERRITIN, TIBC, IRON, RETICCTPCT in the last 72 hours.  Coagulation profile No results for input(s): INR, PROTIME in the last 168 hours.  No results for input(s): DDIMER in the last 72 hours.  Cardiac Enzymes No results for input(s): CKMB, TROPONINI, MYOGLOBIN in the last 168 hours.  Invalid input(s): CK ------------------------------------------------------------------------------------------------------------------    Component Value Date/Time   BNP 432.5 (H) 11/18/2014 0600    Inpatient Medications  Scheduled Meds: . amLODipine  10 mg Oral Daily  . apixaban  5 mg Oral BID  . furosemide  40 mg Oral BID  . losartan  100 mg Oral Daily  . multivitamin with minerals  1 tablet Oral Daily  . potassium chloride SA  10 mEq Oral Daily  . pravastatin  40 mg Oral Daily   Continuous Infusions: PRN Meds:.acetaminophen **OR** acetaminophen, albuterol, diphenhydrAMINE, fentaNYL (SUBLIMAZE) injection, HYDROcodone-acetaminophen, nitroGLYCERIN, ondansetron **OR** ondansetron (ZOFRAN) IV, ondansetron  Micro Results No results found for this or any previous visit (from the past 240 hour(s)).  Radiology Reports Dg Lumbar Spine Complete  Result Date: 08/18/2016 CLINICAL DATA:  Fall EXAM: LUMBAR SPINE - COMPLETE 4+ VIEW COMPARISON:  CT abdomen pelvis 09/21/2009 FINDINGS: Mild scoliosis of the spine. Lumbar alignment within normal limits. Vertebral body heights are grossly maintained. Minimal superior endplate concavity at T11. Moderate-to-marked narrowing at L2-L3 and L3-L4 with endplate changes and osteophyte.  Atherosclerotic vascular calcifications IMPRESSION: 1. Minimal superior endplate concavity at T11 uncertain chronicity, correlate clinically for point tenderness 2. Otherwise no radiographic evidence for acute osseous abnormality Electronically Signed   By: Jasmine Pang M.D.   On: 08/18/2016 18:07   Ct Knee Left Wo Contrast  Result Date: 08/19/2016 CLINICAL DATA:  Left knee pain, fall in bathtub yesterday, lateral tibial plateau fracture. EXAM: CT OF THE left KNEE WITHOUT CONTRAST TECHNIQUE: Multidetector CT imaging of the left knee was performed according to the standard protocol. Multiplanar CT image reconstructions were also generated. COMPARISON:  08/18/2016 FINDINGS: Bones/Joint/Cartilage Schatzker 2 lateral split-depressed fracture of the lateral tibial plateau. The central depressed component is displaced down about 7 mm laterally but not medially. The vertical fracture plane component is standard and extends into the tibial side of the proximal tibiofibular articulation. Small knee effusion.  Tricompartmental spurring. Ligaments Suboptimally assessed by CT. Muscles and Tendons Linear ossification in the patellar tendon with mild surrounding stranding. Spurring at the quadriceps attachment site. Soft tissues Subcutaneous edema inferolateral to the patella. IMPRESSION: 1. Schatzker 2 lateral split -depressed fracture of the lateral tibial  plateau. Small knee effusion. 2. Tricompartmental spurring. Electronically Signed   By: Gaylyn Rong M.D.   On: 08/19/2016 08:13   Mr Thoracic Spine Wo Contrast  Result Date: 08/20/2016 CLINICAL DATA:  Larey Seat at home getting out of the shower. T11 compression fracture questioned at radiography. EXAM: MRI THORACIC SPINE WITHOUT CONTRAST TECHNIQUE: Multiplanar, multisequence MR imaging of the thoracic spine was performed. No intravenous contrast was administered. COMPARISON:  Radiography 08/18/2016 FINDINGS: Alignment: Increased cervical lordosis and increased  thoracic kyphosis. Vertebrae: Benign appearing hemangioma within the T3 vertebral body. No other focal bone finding. No fracture. Specifically, T11 is negative. Cord:  No cord compression or primary cord lesion. Paraspinal and other soft tissues: Small pleural effusions and dependent pulmonary atelectasis. Bochdalek's hernia on the right. Disc levels: No disc bulge or herniation. No stenosis of the canal or foramina. No significant facet arthropathy. IMPRESSION: There is no fracture at T11. T11 looks normal. No fracture anywhere else in the thoracic region. No cause of an acute pain syndrome is identified. Benign appearing hemangioma within the T3 vertebral body, not clinically significant. Electronically Signed   By: Paulina Fusi M.D.   On: 08/20/2016 14:27   Dg Knee Complete 4 Views Left  Result Date: 08/18/2016 CLINICAL DATA:  83 year old female with history of trauma from a fall while taking a shower this morning complaining of left knee pain. EXAM: LEFT KNEE - COMPLETE 4+ VIEW COMPARISON:  No priors. FINDINGS: There is an oblique intra-articular fracture through the lateral tibial plateau which appears nondisplaced. Small suprapatellar effusion. Femur and fibula appear intact. Overlying soft tissues are swollen. IMPRESSION: 1. Nondisplaced oblique intra-articular fracture through the lateral tibial plateau. Electronically Signed   By: Trudie Reed M.D.   On: 08/18/2016 18:06   Dg Femur Port Min 2 Views Left  Result Date: 08/18/2016 CLINICAL DATA:  Fall EXAM: LEFT FEMUR PORTABLE 2 VIEWS COMPARISON:  None. FINDINGS: Surgical clips at the left groin. The left femoral head projects in joint. No fracture or dislocation involving proximal left femur. Partially visualized lateral tibial plateau fracture. IMPRESSION: 1. No acute osseous abnormality of the imaged portions of proximal left femur. 2. Partially visualized fracture of the lateral tibial plateau. Electronically Signed   By: Jasmine Pang M.D.    On: 08/18/2016 18:01    Time Spent in minutes  30   Pearson Grippe M.D on 08/21/2016 at 9:19 AM  Between 7am to 7pm - Pager - [phone]  After 7pm go to [email or web address] - password [facility]  [facility] -  Office  [phone]

## 2016-08-21 NOTE — Progress Notes (Signed)
Subjective: Patient complains of left knee pain. No numbness/tingling   Objective: Vital signs in last 24 hours: Temp:  [98.8 F (37.1 C)-99.3 F (37.4 C)] 98.9 F (37.2 C) (01/21 0541) Pulse Rate:  [75-87] 75 (01/21 0541) Resp:  [18] 18 (01/21 0541) BP: (124-133)/(51-53) 133/51 (01/21 0541) SpO2:  [92 %-95 %] 95 % (01/21 0541)  Intake/Output from previous day: 01/20 0701 - 01/21 0700 In: 360 [P.O.:360] Out: -  Intake/Output this shift: No intake/output data recorded.   Recent Labs  08/18/16 1845 08/19/16 0340 08/20/16 0340 08/21/16 0520  HGB 9.9* 8.3* 8.1* 8.2*    Recent Labs  08/20/16 0340 08/21/16 0520  WBC 12.0* 10.1  RBC 3.43* 3.44*  HCT 27.3* 27.4*  PLT 211 225    Recent Labs  08/19/16 0340 08/21/16 0520  NA 142 142  K 4.3 4.7  CL 108 106  CO2 27 30  BUN 14 21*  CREATININE 0.93 1.09*  GLUCOSE 108* 99  CALCIUM 8.5* 8.4*   No results for input(s): LABPT, INR in the last 72 hours.  Neurovascular intact Sensation intact distally Compartment soft  Assessment/Plan: Left tibial plateau fracture- Doing well. Continue non-op management   Kikuye Korenek V 08/21/2016, 8:51 AM

## 2016-08-22 DIAGNOSIS — I5032 Chronic diastolic (congestive) heart failure: Secondary | ICD-10-CM

## 2016-08-22 DIAGNOSIS — R262 Difficulty in walking, not elsewhere classified: Secondary | ICD-10-CM

## 2016-08-22 DIAGNOSIS — Z8679 Personal history of other diseases of the circulatory system: Secondary | ICD-10-CM

## 2016-08-22 DIAGNOSIS — W19XXXD Unspecified fall, subsequent encounter: Secondary | ICD-10-CM | POA: Diagnosis not present

## 2016-08-22 DIAGNOSIS — S22080D Wedge compression fracture of T11-T12 vertebra, subsequent encounter for fracture with routine healing: Secondary | ICD-10-CM | POA: Diagnosis not present

## 2016-08-22 DIAGNOSIS — S82102A Unspecified fracture of upper end of left tibia, initial encounter for closed fracture: Secondary | ICD-10-CM | POA: Diagnosis not present

## 2016-08-22 DIAGNOSIS — I10 Essential (primary) hypertension: Secondary | ICD-10-CM | POA: Diagnosis not present

## 2016-08-22 DIAGNOSIS — I4891 Unspecified atrial fibrillation: Secondary | ICD-10-CM

## 2016-08-22 DIAGNOSIS — S82102D Unspecified fracture of upper end of left tibia, subsequent encounter for closed fracture with routine healing: Secondary | ICD-10-CM | POA: Diagnosis not present

## 2016-08-22 DIAGNOSIS — Z9889 Other specified postprocedural states: Secondary | ICD-10-CM

## 2016-08-22 DIAGNOSIS — S728X9A Other fracture of unspecified femur, initial encounter for closed fracture: Secondary | ICD-10-CM | POA: Diagnosis not present

## 2016-08-22 DIAGNOSIS — S82142A Displaced bicondylar fracture of left tibia, initial encounter for closed fracture: Secondary | ICD-10-CM | POA: Diagnosis not present

## 2016-08-22 DIAGNOSIS — S82202D Unspecified fracture of shaft of left tibia, subsequent encounter for closed fracture with routine healing: Secondary | ICD-10-CM | POA: Diagnosis not present

## 2016-08-22 DIAGNOSIS — R531 Weakness: Secondary | ICD-10-CM | POA: Diagnosis not present

## 2016-08-22 LAB — COMPREHENSIVE METABOLIC PANEL
ALT: 11 U/L — ABNORMAL LOW (ref 14–54)
ANION GAP: 8 (ref 5–15)
AST: 21 U/L (ref 15–41)
Albumin: 2.6 g/dL — ABNORMAL LOW (ref 3.5–5.0)
Alkaline Phosphatase: 59 U/L (ref 38–126)
BILIRUBIN TOTAL: 0.7 mg/dL (ref 0.3–1.2)
BUN: 22 mg/dL — ABNORMAL HIGH (ref 6–20)
CO2: 27 mmol/L (ref 22–32)
Calcium: 8.4 mg/dL — ABNORMAL LOW (ref 8.9–10.3)
Chloride: 104 mmol/L (ref 101–111)
Creatinine, Ser: 1.1 mg/dL — ABNORMAL HIGH (ref 0.44–1.00)
GFR calc non Af Amer: 44 mL/min — ABNORMAL LOW (ref >60)
GFR, EST AFRICAN AMERICAN: 51 mL/min — AB (ref >60)
Glucose, Bld: 96 mg/dL (ref 65–99)
POTASSIUM: 4.7 mmol/L (ref 3.5–5.1)
Sodium: 139 mmol/L (ref 135–145)
TOTAL PROTEIN: 5.6 g/dL — AB (ref 6.5–8.1)

## 2016-08-22 LAB — CBC
HEMATOCRIT: 29.6 % — AB (ref 36.0–46.0)
Hemoglobin: 8.7 g/dL — ABNORMAL LOW (ref 12.0–15.0)
MCH: 23.4 pg — ABNORMAL LOW (ref 26.0–34.0)
MCHC: 29.4 g/dL — AB (ref 30.0–36.0)
MCV: 79.6 fL (ref 78.0–100.0)
PLATELETS: 244 10*3/uL (ref 150–400)
RBC: 3.72 MIL/uL — ABNORMAL LOW (ref 3.87–5.11)
RDW: 15.5 % (ref 11.5–15.5)
WBC: 9.5 10*3/uL (ref 4.0–10.5)

## 2016-08-22 MED ORDER — BISACODYL 10 MG RE SUPP
10.0000 mg | Freq: Once | RECTAL | Status: AC
  Administered 2016-08-22: 10 mg via RECTAL
  Filled 2016-08-22: qty 1

## 2016-08-22 MED ORDER — TRAMADOL HCL 50 MG PO TABS: 50.0000 mg | ORAL_TABLET | Freq: Four times a day (QID) | ORAL | 0 refills | Status: AC | PRN

## 2016-08-22 MED ORDER — HYDROCODONE-ACETAMINOPHEN 5-325 MG PO TABS: 1.0000 | ORAL_TABLET | Freq: Four times a day (QID) | ORAL | 0 refills | Status: DC | PRN

## 2016-08-22 NOTE — Progress Notes (Signed)
Report called to Tiffany Perdue-RN at [facility]. All questions/concerns addressed. IV removed and belongings gathered. PTAR to transport. Will continue to monitor

## 2016-08-22 NOTE — Progress Notes (Signed)
Physical Therapy Treatment Patient Details Name: Tami Ramirez MRN: [id number] DOB: [birth year] Today's Date: 08/22/2016    History of Present Illness 83 yo female admitted through ED on 08/18/16 following a fall in the shower at an ALF where she was staying short term resulting in a T11 fx and left tibial fracture. Pt is wearing TLSO for T11 fracture and is currently non-operative but awaiting MD consultation. PMH significant for ASCVD, A fib, chornic diaslolic heart failure, CKD, equilibrium disorder, HTN, dyspnea, mitral valve repair and maze operation for a-fib 10/07/09, Op, Stroke.     PT Comments    Pt's progress limited significantly by her desire to sleep and remain supine in bed. Pt wearing KI on L but externally rotates hip to the point that she is bending knee within KI, pt would not let this PT straighten knee or adjust KI. Max A +2 for supine to sit and tot A+2 to return to supine. PT will continue to follow.   Follow Up Recommendations  SNF     Equipment Recommendations  None recommended by PT    Recommendations for Other Services OT consult     Precautions / Restrictions Precautions Precautions: Fall Required Braces or Orthoses: Spinal Brace;Knee Immobilizer - Left Knee Immobilizer - Left: On at all times Spinal Brace: Thoracolumbosacral orthotic;Applied in sitting position Spinal Brace Comments: On when walking  Restrictions Weight Bearing Restrictions: Yes LLE Weight Bearing: Non weight bearing    Mobility  Bed Mobility Overal bed mobility: Needs Assistance Bed Mobility: Supine to Sit;Sit to Supine     Supine to sit: Max assist;+2 for physical assistance Sit to supine: +2 for physical assistance;Total assist   General bed mobility comments: first attempte to have pt roll to left but pt screamed and resisted all rolling. Then pt was pivoted with pad into sitting. She held onto rail but was still more resistive than assisting. Tot A +2 for return to supine and  pt reported nausea from sitting up  Transfers                 General transfer comment: pt refuses  Ambulation/Gait                 Stairs            Wheelchair Mobility    Modified Rankin (Stroke Patients Only)       Balance Overall balance assessment: Needs assistance Sitting-balance support: Bilateral upper extremity supported;Feet supported Sitting balance-Leahy Scale: Fair Sitting balance - Comments: Uses railings to assist with balance at EOB Postural control: Posterior lean                          Cognition Arousal/Alertness: Lethargic;Suspect due to medications Behavior During Therapy: [facility] for tasks assessed/performed Overall Cognitive Status: Impaired/Different from baseline Area of Impairment: Orientation Orientation Level: Disoriented to;Time   Memory: Decreased short-term memory              Exercises      General Comments General comments (skin integrity, edema, etc.): pt reports that she is sore all over and educated her on the roll of mobility in helping with the soreness but she remained adamant that it would be better if she just slept      Pertinent Vitals/Pain Pain Assessment: Faces Faces Pain Scale: Hurts even more Pain Location: low back and LLE with mobility Pain Descriptors / Indicators: Aching;Grimacing;Sore Pain Intervention(s): Limited activity within patient's tolerance;Monitored during session;Premedicated  before session;Repositioned         O2 sats 94% on 2L O2  Home Living                      Prior Function            PT Goals (current goals can now be found in the care plan section) Acute Rehab PT Goals Patient Stated Goal: to have less pain PT Goal Formulation: With patient Time For Goal Achievement: 08/26/16 Potential to Achieve Goals: Fair Progress towards PT goals: Not progressing toward goals - comment (does not want to move)    Frequency    Min 3X/week      PT Plan  Current plan remains appropriate    Co-evaluation             End of Session Equipment Utilized During Treatment: Left knee immobilizer Activity Tolerance: Patient limited by pain Patient left: in bed;with call bell/phone within reach;with bed alarm set     Time: [id number] PT Time Calculation (min) (ACUTE ONLY): 13 min  Charges:  $Therapeutic Activity: 8-22 mins                    G Codes:     Lyanne CoVictoria My Rinke, PT  Acute Rehab Services  [phone]  Lawana ChambersVictoria L Jamesa Tedrick 08/22/2016, 12:07 PM

## 2016-08-22 NOTE — Discharge Summary (Signed)
Physician Discharge Summary  Tami Ramirez [id number] DOB: 1929/07/18 DOA: 08/18/2016  PCP: Lillia MountainGRIFFIN,JOHN JOSEPH, MD  Admit date: 08/18/2016 Discharge date: 08/22/2016  Admitted From: Home Disposition: SNF  Recommendations for Outpatient Follow-up:  1. Follow up with PCP in 1-2 weeks, Follow-up with Dr. Carola FrostHandy in 1 week 2. Please obtain BMP/CBC in one week  Home Health: NA Equipment/Devices:NA  Discharge Condition: Stable CODE STATUS: Full Code Diet recommendation: Diet Heart Room service appropriate? Yes; Fluid consistency: Thin; Fluid restriction: 1200 mL Fluid  Brief/Interim Summary: 83 y.o.femalewith history of atrial fibrillation, diastolic CHF, history of stroke, hypertension was brought to the ER after patient had a fall. Patient usually lives at home but due to change in weather condition and snow patient was temporarily placed in assisted living facility. After taking ashower patient stepped out of the bathroom when patient slipped and fell. Denies hitting her head or losing consciousness. Due to persistent pain in the low back and left knee patient was brought to the ER and x-rays revealed left tibial lateral fracture and T11 compression fracture. Patient is finding it difficult to ambulate due to pain. Patient is being admitted for further management.  Discharge Diagnoses:  Principal Problem:   Left tibial fracture Active Problems:   S/P mitral valve repair   S/P Maze operation for atrial fibrillation   Essential hypertension, benign   Atrial fibrillation (HCC)   Chronic diastolic heart failure (HCC)   Ambulatory dysfunction   Closed wedge compression fracture of T11 vertebra (HCC)   Tibial fracture   Left tibial fracture. CT -Schatzker 2 lateral split -depressed fracture of the lateral tibial plateau. Small knee effusion -Seen by orthopedics and recommended conservative management. -Continue pain control and knee stabilization. -Discharged on tramadol 50 mg  every 6 hours as needed, had some itching with the Norco.  ? T11 compression fracture status post mechanical fall -On admission x-rays show T11 compression fractures, MRI was done and showed no evidence of compression fracture. -Was on TLSO, this was taken off continue physical therapy and pain control, likely sprain  Chronic atrial fibrillation -CHA2DS2-VASc score is greater than 4, because of age, gender and history of stroke. -Is on a liquids, continued, rate is controlled.,  Chronic diastolic CHF, pulmonary HTN.History of mitral valve repair and maze procedure. -clinically euvolemic. - continue Lasix and potassium.  Hypertension on Cozaar and amlodipine.  History of CVA- on statins and apixaban.  Chronic anemiawith mild worsening of hemoglobin, but no s/s of active bleeding. Monitor Hg -Hemoglobin is stable at 8.7 and the day of discharge.  Low-grade fever -Had a low-grade fever of 99.7, without leukocytosis and without other evidence of infection. Likely postoperative atelectasis. -If patient continues to have fever check CXR and UA in the nursing home.  Discharge Instructions   Allergies as of 08/22/2016      Reactions   Morphine Sulfate Other (See Comments)   Low heart rate    Penicillins Rash   Spironolactone Other (See Comments)   hyperkalemia   Codeine Nausea Only   Darvocet [propoxyphene N-acetaminophen] Other (See Comments)   Spaced out   Lisinopril Cough   Norco [hydrocodone-acetaminophen] Itching      Medication List    TAKE these medications   acetaminophen 325 MG tablet Commonly known as:  TYLENOL Take 650 mg by mouth every 6 (six) hours as needed for mild pain.   AEROCHAMBER PLUS inhaler Use as instructed   Albuterol Sulfate 108 (90 Base) MCG/ACT Aepb Commonly known as:  PROAIR RESPICLICK  Inhale 2 puffs into the lungs every 4 (four) hours as needed.   albuterol (2.5 MG/3ML) 0.083% nebulizer solution Commonly known as:   PROVENTIL Take 3 mLs (2.5 mg total) by nebulization every 6 (six) hours as needed for wheezing or shortness of breath.   amLODipine 10 MG tablet Commonly known as:  NORVASC Take 1 tablet (10 mg total) by mouth daily.   apixaban 5 MG Tabs tablet Commonly known as:  ELIQUIS Take 1 tablet (5 mg total) by mouth 2 (two) times daily.   furosemide 40 MG tablet Commonly known as:  LASIX TAKE 1 TABLET TWICE DAILY What changed:  See the new instructions.   losartan 100 MG tablet Commonly known as:  COZAAR TAKE 1 TABLET EVERY DAY   multivitamin with minerals Tabs tablet Take 1 tablet by mouth daily.   nitroGLYCERIN 0.4 MG SL tablet Commonly known as:  NITROSTAT Place 1 tablet (0.4 mg total) under the tongue every 5 (five) minutes as needed.   ondansetron 4 MG disintegrating tablet Commonly known as:  ZOFRAN ODT Take 1 tablet (4 mg total) by mouth every 8 (eight) hours as needed for nausea or vomiting.   potassium chloride SA 20 MEQ tablet Commonly known as:  K-DUR,KLOR-CON Take 10 mEq by mouth daily.   pravastatin 40 MG tablet Commonly known as:  PRAVACHOL Take 1 tablet (40 mg total) by mouth daily.   traMADol 50 MG tablet Commonly known as:  ULTRAM Take 1 tablet (50 mg total) by mouth every 6 (six) hours as needed for severe pain.      Follow-up Information    Lillia Mountain, MD Follow up in 1 week(s).   Specialty:  Internal Medicine Contact information: [address] [place] [address] [phone]        Budd Palmer, MD Follow up in 1 week(s).   Specialty:  Orthopedic Surgery Contact information: [address] [place] [address] [phone]          Allergies  Allergen Reactions  . Morphine Sulfate Other (See Comments)    Low heart rate   . Penicillins Rash  . Spironolactone Other (See Comments)    hyperkalemia  . Codeine Nausea Only  . Darvocet [Propoxyphene N-Acetaminophen] Other (See Comments)     Spaced out  . Lisinopril Cough  . Norco [Hydrocodone-Acetaminophen] Itching    Consultations:  Treatment Team:   Venita Lick, MD  Procedures None  Radiological studies: Dg Lumbar Spine Complete  Result Date: 08/18/2016 CLINICAL DATA:  Fall EXAM: LUMBAR SPINE - COMPLETE 4+ VIEW COMPARISON:  CT abdomen pelvis 09/21/2009 FINDINGS: Mild scoliosis of the spine. Lumbar alignment within normal limits. Vertebral body heights are grossly maintained. Minimal superior endplate concavity at T11. Moderate-to-marked narrowing at L2-L3 and L3-L4 with endplate changes and osteophyte. Atherosclerotic vascular calcifications IMPRESSION: 1. Minimal superior endplate concavity at T11 uncertain chronicity, correlate clinically for point tenderness 2. Otherwise no radiographic evidence for acute osseous abnormality Electronically Signed   By: Jasmine Pang M.D.   On: 08/18/2016 18:07   Ct Knee Left Wo Contrast  Result Date: 08/19/2016 CLINICAL DATA:  Left knee pain, fall in bathtub yesterday, lateral tibial plateau fracture. EXAM: CT OF THE left KNEE WITHOUT CONTRAST TECHNIQUE: Multidetector CT imaging of the left knee was performed according to the standard protocol. Multiplanar CT image reconstructions were also generated. COMPARISON:  08/18/2016 FINDINGS: Bones/Joint/Cartilage Schatzker 2 lateral split-depressed fracture of the lateral tibial plateau. The central depressed component is displaced down  about 7 mm laterally but not medially. The vertical fracture plane component is standard and extends into the tibial side of the proximal tibiofibular articulation. Small knee effusion.  Tricompartmental spurring. Ligaments Suboptimally assessed by CT. Muscles and Tendons Linear ossification in the patellar tendon with mild surrounding stranding. Spurring at the quadriceps attachment site. Soft tissues Subcutaneous edema inferolateral to the patella. IMPRESSION: 1. Schatzker 2 lateral split -depressed fracture of  the lateral tibial plateau. Small knee effusion. 2. Tricompartmental spurring. Electronically Signed   By: Gaylyn Rong M.D.   On: 08/19/2016 08:13   Mr Thoracic Spine Wo Contrast  Result Date: 08/20/2016 CLINICAL DATA:  Larey Seat at home getting out of the shower. T11 compression fracture questioned at radiography. EXAM: MRI THORACIC SPINE WITHOUT CONTRAST TECHNIQUE: Multiplanar, multisequence MR imaging of the thoracic spine was performed. No intravenous contrast was administered. COMPARISON:  Radiography 08/18/2016 FINDINGS: Alignment: Increased cervical lordosis and increased thoracic kyphosis. Vertebrae: Benign appearing hemangioma within the T3 vertebral body. No other focal bone finding. No fracture. Specifically, T11 is negative. Cord:  No cord compression or primary cord lesion. Paraspinal and other soft tissues: Small pleural effusions and dependent pulmonary atelectasis. Bochdalek's hernia on the right. Disc levels: No disc bulge or herniation. No stenosis of the canal or foramina. No significant facet arthropathy. IMPRESSION: There is no fracture at T11. T11 looks normal. No fracture anywhere else in the thoracic region. No cause of an acute pain syndrome is identified. Benign appearing hemangioma within the T3 vertebral body, not clinically significant. Electronically Signed   By: Paulina Fusi M.D.   On: 08/20/2016 14:27   Dg Knee Complete 4 Views Left  Result Date: 08/18/2016 CLINICAL DATA:  83 year old female with history of trauma from a fall while taking a shower this morning complaining of left knee pain. EXAM: LEFT KNEE - COMPLETE 4+ VIEW COMPARISON:  No priors. FINDINGS: There is an oblique intra-articular fracture through the lateral tibial plateau which appears nondisplaced. Small suprapatellar effusion. Femur and fibula appear intact. Overlying soft tissues are swollen. IMPRESSION: 1. Nondisplaced oblique intra-articular fracture through the lateral tibial plateau. Electronically  Signed   By: Trudie Reed M.D.   On: 08/18/2016 18:06   Dg Femur Port Min 2 Views Left  Result Date: 08/18/2016 CLINICAL DATA:  Fall EXAM: LEFT FEMUR PORTABLE 2 VIEWS COMPARISON:  None. FINDINGS: Surgical clips at the left groin. The left femoral head projects in joint. No fracture or dislocation involving proximal left femur. Partially visualized lateral tibial plateau fracture. IMPRESSION: 1. No acute osseous abnormality of the imaged portions of proximal left femur. 2. Partially visualized fracture of the lateral tibial plateau. Electronically Signed   By: Jasmine Pang M.D.   On: 08/18/2016 18:01     Subjective:  Discharge Exam: Vitals:   08/21/16 1049 08/21/16 1340 08/21/16 2125 08/22/16 0441  BP: (!) 111/40 (!) 106/51 (!) 133/47 128/77  Pulse: 78 80 82 84  Resp: 16 17 18 18   Temp:  98.3 F (36.8 C) 99.1 F (37.3 C) 98.4 F (36.9 C)  TempSrc:   Oral Oral  SpO2: 96% 95% 90% 94%  Weight:    60.2 kg (132 lb 11.5 oz)   General: Pt is alert, awake, not in acute distress Cardiovascular: RRR, S1/S2 +, no rubs, no gallops Respiratory: CTA bilaterally, no wheezing, no rhonchi Abdominal: Soft, NT, ND, bowel sounds + Extremities: no edema, no cyanosis   The results of significant diagnostics from this hospitalization (including imaging, microbiology, ancillary and laboratory) are  listed below for reference.    Microbiology: No results found for this or any previous visit (from the past 240 hour(s)).   Labs: BNP (last 3 results) No results for input(s): BNP in the last 8760 hours. Basic Metabolic Panel:  Recent Labs Lab 08/18/16 1845 08/19/16 0340 08/21/16 0520 08/22/16 0350  NA 142 142 142 139  K 4.1 4.3 4.7 4.7  CL 109 108 106 104  CO2 21* 27 30 27   GLUCOSE 119* 108* 99 96  BUN 15 14 21* 22*  CREATININE 0.93 0.93 1.09* 1.10*  CALCIUM 9.0 8.5* 8.4* 8.4*   Liver Function Tests:  Recent Labs Lab 08/21/16 0520 08/22/16 0350  AST 15 21  ALT 10* 11*  ALKPHOS  63 59  BILITOT 0.5 0.7  PROT 5.5* 5.6*  ALBUMIN 2.5* 2.6*   No results for input(s): LIPASE, AMYLASE in the last 168 hours. No results for input(s): AMMONIA in the last 168 hours. CBC:  Recent Labs Lab 08/18/16 1845 08/19/16 0340 08/20/16 0340 08/21/16 0520 08/22/16 0350  WBC 14.1* 10.9* 12.0* 10.1 9.5  NEUTROABS 11.3*  --   --   --   --   HGB 9.9* 8.3* 8.1* 8.2* 8.7*  HCT 32.7* 27.5* 27.3* 27.4* 29.6*  MCV 78.2 78.6 79.6 79.7 79.6  PLT 234 222 211 225 244   Cardiac Enzymes: No results for input(s): CKTOTAL, CKMB, CKMBINDEX, TROPONINI in the last 168 hours. BNP: Invalid input(s): POCBNP CBG: No results for input(s): GLUCAP in the last 168 hours. D-Dimer No results for input(s): DDIMER in the last 72 hours. Hgb A1c No results for input(s): HGBA1C in the last 72 hours. Lipid Profile No results for input(s): CHOL, HDL, LDLCALC, TRIG, CHOLHDL, LDLDIRECT in the last 72 hours. Thyroid function studies No results for input(s): TSH, T4TOTAL, T3FREE, THYROIDAB in the last 72 hours.  Invalid input(s): FREET3 Anemia work up No results for input(s): VITAMINB12, FOLATE, FERRITIN, TIBC, IRON, RETICCTPCT in the last 72 hours. Urinalysis    Component Value Date/Time   COLORURINE YELLOW 10/25/2015 1837   APPEARANCEUR CLOUDY (A) 10/25/2015 1837   LABSPEC 1.011 10/25/2015 1837   PHURINE 6.0 10/25/2015 1837   GLUCOSEU NEGATIVE 10/25/2015 1837   HGBUR SMALL (A) 10/25/2015 1837   BILIRUBINUR NEGATIVE 10/25/2015 1837   BILIRUBINUR negative 09/27/2015 1246   KETONESUR NEGATIVE 10/25/2015 1837   PROTEINUR NEGATIVE 10/25/2015 1837   UROBILINOGEN 0.2 09/27/2015 1246   UROBILINOGEN 0.2 11/18/2014 0035   NITRITE POSITIVE (A) 10/25/2015 1837   LEUKOCYTESUR TRACE (A) 10/25/2015 1837   Sepsis Labs Invalid input(s): PROCALCITONIN,  WBC,  LACTICIDVEN Microbiology No results found for this or any previous visit (from the past 240 hour(s)).   Time coordinating discharge: Over 30  minutes  SIGNED:   Clint Lipps, MD  [facility] 08/22/2016, 10:34 AM Pager   If 7PM-7AM, please contact night-coverage [email or web address] Password [email or web address]

## 2016-08-22 NOTE — Consult Note (Signed)
[facility] CM Primary Care Navigator  08/22/2016  Tami Ramirez 1929-03-17 [id number]  Met with patient the bedside to identify possible discharge needs. Patient reports that she stepped out of the bathroom when she slipped and fell after taking a shower that had led to this admission/surgery.  Patient endorses Dr. Lavone Orn with [facility] at [facility] as the primary care provider.    Patient shared using  [place] and [facility] to obtain medications without any problem.   Patient's daughter Lattie Haw) manages her medications at home using "pill box" system.   She states that her daughter provides transportation to her doctors' appointments.  Patient reports having 2 ladies who comes alternately every other day as her caregivers at home and her daughter assists with care needs as well   Discharge plan is skilled nursing facility ([facility]) in [place] for rehabilitation as reported.  Patient voiced understanding to call primary care provider's office once she returns back to home, for a post discharge follow-up appointment within a week or sooner if needs arise. Patient letter provided as a reminder.  Patient denies further issues or concerns at this time. No discharge needs identified at present. For additional questions please contact:  Edwena Felty A. Elise Gladden, BSN, RN-BC [facility] PRIMARY CARE Navigator Cell: [phone]

## 2016-08-22 NOTE — Clinical Social Work Placement (Signed)
   CLINICAL SOCIAL WORK PLACEMENT  NOTE  Date:  08/22/2016  Patient Details  Name: Tami Ramirez MRN: [id number] Date of Birth: 1929/03/07  Clinical Social Work is seeking post-discharge placement for this patient at the Skilled  Nursing Facility level of care (*CSW will initial, date and re-position this form in  chart as items are completed):  Yes   Patient/family provided with [place] Clinical Social Work Department's list of facilities offering this level of care within the geographic area requested by the patient (or if unable, by the patient's family).  Yes   Patient/family informed of their freedom to choose among providers that offer the needed level of care, that participate in Medicare, Medicaid or managed care program needed by the patient, have an available bed and are willing to accept the patient.  Yes   Patient/family informed of [facility]'s ownership interest in [facility] and [facility], as well as of the fact that they are under no obligation to receive care at these facilities.  PASRR submitted to EDS on       PASRR number received on 08/20/16     Existing PASRR number confirmed on       FL2 transmitted to all facilities in geographic area requested by pt/family on 08/20/16     FL2 transmitted to all facilities within larger geographic area on       Patient informed that his/her managed care company has contracts with or will negotiate with certain facilities, including the following:        Yes   Patient/family informed of bed offers received.  [facility]     Physician recommends and patient chooses bed at      Patient to be transferred to [facility], [place] on 08/22/16.  Patient to be transferred to facility by Ambulance     Patient family notified on 08/22/16 of transfer.  Name of family member notified:  Misty StanleyLisa     PHYSICIAN Please prepare priority discharge summary, including medications, Please prepare  prescriptions, Please sign FL2     Additional Comment:  Per MD patient ready for DC to [facility] [place]. RN, patient, patient's family, and facility notified of DC. RN given number for report. DC packet on chart. Ambulance transport requested for patient. RN will call daughter once patient has been picked up by EMS. CSW signing off.   _______________________________________________ Venita Lickampbell, Famous Eisenhardt B, LCSW 08/22/2016, 1:15 PM

## 2016-08-22 NOTE — Clinical Social Work Note (Signed)
HTA auth received for [facility] to [facility] [id number].

## 2016-08-23 NOTE — ED Notes (Signed)
50mcg fentanyl wasted with Anell BarrKaren Cobb, RN on August 23, 2016 at 07:37.

## 2016-09-14 DIAGNOSIS — S82102D Unspecified fracture of upper end of left tibia, subsequent encounter for closed fracture with routine healing: Secondary | ICD-10-CM | POA: Diagnosis not present

## 2016-09-14 DIAGNOSIS — S82102A Unspecified fracture of upper end of left tibia, initial encounter for closed fracture: Secondary | ICD-10-CM | POA: Diagnosis not present

## 2016-09-16 DIAGNOSIS — I10 Essential (primary) hypertension: Secondary | ICD-10-CM | POA: Diagnosis not present

## 2016-09-16 DIAGNOSIS — S82142A Displaced bicondylar fracture of left tibia, initial encounter for closed fracture: Secondary | ICD-10-CM | POA: Diagnosis not present

## 2016-09-21 DIAGNOSIS — S82102A Unspecified fracture of upper end of left tibia, initial encounter for closed fracture: Secondary | ICD-10-CM | POA: Diagnosis not present

## 2016-09-21 DIAGNOSIS — S82102D Unspecified fracture of upper end of left tibia, subsequent encounter for closed fracture with routine healing: Secondary | ICD-10-CM | POA: Diagnosis not present

## 2016-09-22 DIAGNOSIS — I5032 Chronic diastolic (congestive) heart failure: Secondary | ICD-10-CM | POA: Diagnosis not present

## 2016-09-22 DIAGNOSIS — Z8673 Personal history of transient ischemic attack (TIA), and cerebral infarction without residual deficits: Secondary | ICD-10-CM | POA: Diagnosis not present

## 2016-09-22 DIAGNOSIS — M8088XD Other osteoporosis with current pathological fracture, vertebra(e), subsequent encounter for fracture with routine healing: Secondary | ICD-10-CM | POA: Diagnosis not present

## 2016-09-22 DIAGNOSIS — Z9181 History of falling: Secondary | ICD-10-CM | POA: Diagnosis not present

## 2016-09-22 DIAGNOSIS — Z7901 Long term (current) use of anticoagulants: Secondary | ICD-10-CM | POA: Diagnosis not present

## 2016-09-22 DIAGNOSIS — M80061D Age-related osteoporosis with current pathological fracture, right lower leg, subsequent encounter for fracture with routine healing: Secondary | ICD-10-CM | POA: Diagnosis not present

## 2016-09-22 DIAGNOSIS — I4891 Unspecified atrial fibrillation: Secondary | ICD-10-CM | POA: Diagnosis not present

## 2016-09-22 DIAGNOSIS — I110 Hypertensive heart disease with heart failure: Secondary | ICD-10-CM | POA: Diagnosis not present

## 2016-10-05 DIAGNOSIS — S82102D Unspecified fracture of upper end of left tibia, subsequent encounter for closed fracture with routine healing: Secondary | ICD-10-CM | POA: Diagnosis not present

## 2016-10-05 DIAGNOSIS — R5381 Other malaise: Secondary | ICD-10-CM | POA: Diagnosis not present

## 2016-10-05 DIAGNOSIS — R8290 Unspecified abnormal findings in urine: Secondary | ICD-10-CM | POA: Diagnosis not present

## 2016-10-05 DIAGNOSIS — I129 Hypertensive chronic kidney disease with stage 1 through stage 4 chronic kidney disease, or unspecified chronic kidney disease: Secondary | ICD-10-CM | POA: Diagnosis not present

## 2016-10-07 DIAGNOSIS — R262 Difficulty in walking, not elsewhere classified: Secondary | ICD-10-CM | POA: Diagnosis not present

## 2016-10-07 DIAGNOSIS — M25562 Pain in left knee: Secondary | ICD-10-CM | POA: Diagnosis not present

## 2016-10-07 DIAGNOSIS — M6281 Muscle weakness (generalized): Secondary | ICD-10-CM | POA: Diagnosis not present

## 2016-10-10 DIAGNOSIS — R262 Difficulty in walking, not elsewhere classified: Secondary | ICD-10-CM | POA: Diagnosis not present

## 2016-10-10 DIAGNOSIS — M6281 Muscle weakness (generalized): Secondary | ICD-10-CM | POA: Diagnosis not present

## 2016-10-10 DIAGNOSIS — M25562 Pain in left knee: Secondary | ICD-10-CM | POA: Diagnosis not present

## 2016-10-12 DIAGNOSIS — M25562 Pain in left knee: Secondary | ICD-10-CM | POA: Diagnosis not present

## 2016-10-12 DIAGNOSIS — M6281 Muscle weakness (generalized): Secondary | ICD-10-CM | POA: Diagnosis not present

## 2016-10-12 DIAGNOSIS — R262 Difficulty in walking, not elsewhere classified: Secondary | ICD-10-CM | POA: Diagnosis not present

## 2016-10-14 DIAGNOSIS — R262 Difficulty in walking, not elsewhere classified: Secondary | ICD-10-CM | POA: Diagnosis not present

## 2016-10-14 DIAGNOSIS — M25562 Pain in left knee: Secondary | ICD-10-CM | POA: Diagnosis not present

## 2016-10-14 DIAGNOSIS — M6281 Muscle weakness (generalized): Secondary | ICD-10-CM | POA: Diagnosis not present

## 2016-10-17 DIAGNOSIS — M25562 Pain in left knee: Secondary | ICD-10-CM | POA: Diagnosis not present

## 2016-10-17 DIAGNOSIS — M6281 Muscle weakness (generalized): Secondary | ICD-10-CM | POA: Diagnosis not present

## 2016-10-17 DIAGNOSIS — R262 Difficulty in walking, not elsewhere classified: Secondary | ICD-10-CM | POA: Diagnosis not present

## 2016-10-20 DIAGNOSIS — M6281 Muscle weakness (generalized): Secondary | ICD-10-CM | POA: Diagnosis not present

## 2016-10-20 DIAGNOSIS — R262 Difficulty in walking, not elsewhere classified: Secondary | ICD-10-CM | POA: Diagnosis not present

## 2016-10-20 DIAGNOSIS — M25562 Pain in left knee: Secondary | ICD-10-CM | POA: Diagnosis not present

## 2016-10-21 DIAGNOSIS — R110 Nausea: Secondary | ICD-10-CM | POA: Diagnosis not present

## 2016-10-21 DIAGNOSIS — I499 Cardiac arrhythmia, unspecified: Secondary | ICD-10-CM | POA: Diagnosis not present

## 2016-10-21 DIAGNOSIS — I952 Hypotension due to drugs: Secondary | ICD-10-CM | POA: Diagnosis not present

## 2016-10-24 DIAGNOSIS — R262 Difficulty in walking, not elsewhere classified: Secondary | ICD-10-CM | POA: Diagnosis not present

## 2016-10-24 DIAGNOSIS — M25562 Pain in left knee: Secondary | ICD-10-CM | POA: Diagnosis not present

## 2016-10-24 DIAGNOSIS — M6281 Muscle weakness (generalized): Secondary | ICD-10-CM | POA: Diagnosis not present

## 2016-10-26 DIAGNOSIS — S82102D Unspecified fracture of upper end of left tibia, subsequent encounter for closed fracture with routine healing: Secondary | ICD-10-CM | POA: Diagnosis not present

## 2016-10-27 DIAGNOSIS — M25562 Pain in left knee: Secondary | ICD-10-CM | POA: Diagnosis not present

## 2016-10-27 DIAGNOSIS — R262 Difficulty in walking, not elsewhere classified: Secondary | ICD-10-CM | POA: Diagnosis not present

## 2016-10-27 DIAGNOSIS — M6281 Muscle weakness (generalized): Secondary | ICD-10-CM | POA: Diagnosis not present

## 2016-10-31 DIAGNOSIS — M25562 Pain in left knee: Secondary | ICD-10-CM | POA: Diagnosis not present

## 2016-10-31 DIAGNOSIS — R262 Difficulty in walking, not elsewhere classified: Secondary | ICD-10-CM | POA: Diagnosis not present

## 2016-10-31 DIAGNOSIS — M6281 Muscle weakness (generalized): Secondary | ICD-10-CM | POA: Diagnosis not present

## 2016-11-02 DIAGNOSIS — R262 Difficulty in walking, not elsewhere classified: Secondary | ICD-10-CM | POA: Diagnosis not present

## 2016-11-02 DIAGNOSIS — M25562 Pain in left knee: Secondary | ICD-10-CM | POA: Diagnosis not present

## 2016-11-02 DIAGNOSIS — M6281 Muscle weakness (generalized): Secondary | ICD-10-CM | POA: Diagnosis not present

## 2016-11-03 DIAGNOSIS — M25562 Pain in left knee: Secondary | ICD-10-CM | POA: Diagnosis not present

## 2016-11-08 DIAGNOSIS — R262 Difficulty in walking, not elsewhere classified: Secondary | ICD-10-CM | POA: Diagnosis not present

## 2016-11-08 DIAGNOSIS — M25562 Pain in left knee: Secondary | ICD-10-CM | POA: Diagnosis not present

## 2016-11-08 DIAGNOSIS — M6281 Muscle weakness (generalized): Secondary | ICD-10-CM | POA: Diagnosis not present

## 2016-11-10 DIAGNOSIS — M6281 Muscle weakness (generalized): Secondary | ICD-10-CM | POA: Diagnosis not present

## 2016-11-10 DIAGNOSIS — M25562 Pain in left knee: Secondary | ICD-10-CM | POA: Diagnosis not present

## 2016-11-10 DIAGNOSIS — R262 Difficulty in walking, not elsewhere classified: Secondary | ICD-10-CM | POA: Diagnosis not present

## 2016-11-11 DIAGNOSIS — R262 Difficulty in walking, not elsewhere classified: Secondary | ICD-10-CM | POA: Diagnosis not present

## 2016-11-11 DIAGNOSIS — M25562 Pain in left knee: Secondary | ICD-10-CM | POA: Diagnosis not present

## 2016-11-11 DIAGNOSIS — M6281 Muscle weakness (generalized): Secondary | ICD-10-CM | POA: Diagnosis not present

## 2016-11-14 DIAGNOSIS — M6281 Muscle weakness (generalized): Secondary | ICD-10-CM | POA: Diagnosis not present

## 2016-11-14 DIAGNOSIS — M25562 Pain in left knee: Secondary | ICD-10-CM | POA: Diagnosis not present

## 2016-11-14 DIAGNOSIS — R262 Difficulty in walking, not elsewhere classified: Secondary | ICD-10-CM | POA: Diagnosis not present

## 2016-11-16 DIAGNOSIS — M25562 Pain in left knee: Secondary | ICD-10-CM | POA: Diagnosis not present

## 2016-11-16 DIAGNOSIS — R262 Difficulty in walking, not elsewhere classified: Secondary | ICD-10-CM | POA: Diagnosis not present

## 2016-11-16 DIAGNOSIS — M6281 Muscle weakness (generalized): Secondary | ICD-10-CM | POA: Diagnosis not present

## 2016-11-18 DIAGNOSIS — R262 Difficulty in walking, not elsewhere classified: Secondary | ICD-10-CM | POA: Diagnosis not present

## 2016-11-18 DIAGNOSIS — M6281 Muscle weakness (generalized): Secondary | ICD-10-CM | POA: Diagnosis not present

## 2016-11-18 DIAGNOSIS — M25562 Pain in left knee: Secondary | ICD-10-CM | POA: Diagnosis not present

## 2016-11-22 DIAGNOSIS — M25562 Pain in left knee: Secondary | ICD-10-CM | POA: Diagnosis not present

## 2016-11-22 DIAGNOSIS — R262 Difficulty in walking, not elsewhere classified: Secondary | ICD-10-CM | POA: Diagnosis not present

## 2016-11-22 DIAGNOSIS — M6281 Muscle weakness (generalized): Secondary | ICD-10-CM | POA: Diagnosis not present

## 2016-11-23 DIAGNOSIS — S82102D Unspecified fracture of upper end of left tibia, subsequent encounter for closed fracture with routine healing: Secondary | ICD-10-CM | POA: Diagnosis not present

## 2016-11-25 DIAGNOSIS — M25562 Pain in left knee: Secondary | ICD-10-CM | POA: Diagnosis not present

## 2016-11-25 DIAGNOSIS — M6281 Muscle weakness (generalized): Secondary | ICD-10-CM | POA: Diagnosis not present

## 2016-11-25 DIAGNOSIS — R262 Difficulty in walking, not elsewhere classified: Secondary | ICD-10-CM | POA: Diagnosis not present

## 2016-11-30 DIAGNOSIS — M25562 Pain in left knee: Secondary | ICD-10-CM | POA: Diagnosis not present

## 2016-11-30 DIAGNOSIS — M6281 Muscle weakness (generalized): Secondary | ICD-10-CM | POA: Diagnosis not present

## 2016-11-30 DIAGNOSIS — R262 Difficulty in walking, not elsewhere classified: Secondary | ICD-10-CM | POA: Diagnosis not present

## 2016-12-22 DIAGNOSIS — R42 Dizziness and giddiness: Secondary | ICD-10-CM | POA: Diagnosis not present

## 2017-01-04 DIAGNOSIS — S82102D Unspecified fracture of upper end of left tibia, subsequent encounter for closed fracture with routine healing: Secondary | ICD-10-CM | POA: Diagnosis not present

## 2017-01-06 DIAGNOSIS — I129 Hypertensive chronic kidney disease with stage 1 through stage 4 chronic kidney disease, or unspecified chronic kidney disease: Secondary | ICD-10-CM | POA: Diagnosis not present

## 2017-01-06 DIAGNOSIS — R269 Unspecified abnormalities of gait and mobility: Secondary | ICD-10-CM | POA: Diagnosis not present

## 2017-01-06 DIAGNOSIS — H8111 Benign paroxysmal vertigo, right ear: Secondary | ICD-10-CM | POA: Diagnosis not present

## 2017-01-24 ENCOUNTER — Ambulatory Visit: Payer: PPO | Admitting: Nurse Practitioner

## 2017-03-10 DIAGNOSIS — N183 Chronic kidney disease, stage 3 (moderate): Secondary | ICD-10-CM | POA: Diagnosis not present

## 2017-03-10 DIAGNOSIS — I2510 Atherosclerotic heart disease of native coronary artery without angina pectoris: Secondary | ICD-10-CM | POA: Diagnosis not present

## 2017-03-10 DIAGNOSIS — I480 Paroxysmal atrial fibrillation: Secondary | ICD-10-CM | POA: Diagnosis not present

## 2017-03-10 DIAGNOSIS — I129 Hypertensive chronic kidney disease with stage 1 through stage 4 chronic kidney disease, or unspecified chronic kidney disease: Secondary | ICD-10-CM | POA: Diagnosis not present

## 2017-03-10 DIAGNOSIS — D509 Iron deficiency anemia, unspecified: Secondary | ICD-10-CM | POA: Diagnosis not present

## 2017-03-10 DIAGNOSIS — I5030 Unspecified diastolic (congestive) heart failure: Secondary | ICD-10-CM | POA: Diagnosis not present

## 2017-03-16 ENCOUNTER — Other Ambulatory Visit: Payer: Self-pay | Admitting: Cardiology

## 2017-03-16 MED ORDER — PRAVASTATIN SODIUM 40 MG PO TABS: 40.0000 mg | ORAL_TABLET | Freq: Every day | ORAL | 0 refills | Status: AC

## 2017-04-03 DIAGNOSIS — I10 Essential (primary) hypertension: Secondary | ICD-10-CM | POA: Diagnosis not present

## 2017-04-03 DIAGNOSIS — N183 Chronic kidney disease, stage 3 (moderate): Secondary | ICD-10-CM | POA: Diagnosis not present

## 2017-04-03 DIAGNOSIS — F0390 Unspecified dementia without behavioral disturbance: Secondary | ICD-10-CM | POA: Diagnosis not present

## 2017-04-05 DIAGNOSIS — I2510 Atherosclerotic heart disease of native coronary artery without angina pectoris: Secondary | ICD-10-CM | POA: Diagnosis not present

## 2017-04-05 DIAGNOSIS — N183 Chronic kidney disease, stage 3 (moderate): Secondary | ICD-10-CM | POA: Diagnosis not present

## 2017-04-05 DIAGNOSIS — F0280 Dementia in other diseases classified elsewhere without behavioral disturbance: Secondary | ICD-10-CM | POA: Diagnosis not present

## 2017-04-05 DIAGNOSIS — I482 Chronic atrial fibrillation: Secondary | ICD-10-CM | POA: Diagnosis not present

## 2017-04-07 DIAGNOSIS — R262 Difficulty in walking, not elsewhere classified: Secondary | ICD-10-CM | POA: Diagnosis not present

## 2017-04-07 DIAGNOSIS — M1990 Unspecified osteoarthritis, unspecified site: Secondary | ICD-10-CM | POA: Diagnosis not present

## 2017-04-07 DIAGNOSIS — M6281 Muscle weakness (generalized): Secondary | ICD-10-CM | POA: Diagnosis not present

## 2017-04-07 DIAGNOSIS — I5030 Unspecified diastolic (congestive) heart failure: Secondary | ICD-10-CM | POA: Diagnosis not present

## 2017-05-01 ENCOUNTER — Ambulatory Visit: Payer: PPO | Admitting: Cardiology

## 2017-05-01 DIAGNOSIS — M6281 Muscle weakness (generalized): Secondary | ICD-10-CM | POA: Diagnosis not present

## 2017-05-01 DIAGNOSIS — M1990 Unspecified osteoarthritis, unspecified site: Secondary | ICD-10-CM | POA: Diagnosis not present

## 2017-05-01 DIAGNOSIS — R262 Difficulty in walking, not elsewhere classified: Secondary | ICD-10-CM | POA: Diagnosis not present

## 2017-05-01 DIAGNOSIS — I5030 Unspecified diastolic (congestive) heart failure: Secondary | ICD-10-CM | POA: Diagnosis not present

## 2017-06-01 DIAGNOSIS — M1990 Unspecified osteoarthritis, unspecified site: Secondary | ICD-10-CM | POA: Diagnosis not present

## 2017-06-01 DIAGNOSIS — R262 Difficulty in walking, not elsewhere classified: Secondary | ICD-10-CM | POA: Diagnosis not present

## 2017-06-01 DIAGNOSIS — I5030 Unspecified diastolic (congestive) heart failure: Secondary | ICD-10-CM | POA: Diagnosis not present

## 2017-06-01 DIAGNOSIS — M6281 Muscle weakness (generalized): Secondary | ICD-10-CM | POA: Diagnosis not present

## 2017-06-28 DIAGNOSIS — I482 Chronic atrial fibrillation: Secondary | ICD-10-CM | POA: Diagnosis not present

## 2017-06-28 DIAGNOSIS — I2510 Atherosclerotic heart disease of native coronary artery without angina pectoris: Secondary | ICD-10-CM | POA: Diagnosis not present

## 2017-06-28 DIAGNOSIS — N183 Chronic kidney disease, stage 3 (moderate): Secondary | ICD-10-CM | POA: Diagnosis not present

## 2017-06-28 DIAGNOSIS — F0280 Dementia in other diseases classified elsewhere without behavioral disturbance: Secondary | ICD-10-CM | POA: Diagnosis not present

## 2017-07-03 DIAGNOSIS — M6281 Muscle weakness (generalized): Secondary | ICD-10-CM | POA: Diagnosis not present

## 2017-07-03 DIAGNOSIS — R262 Difficulty in walking, not elsewhere classified: Secondary | ICD-10-CM | POA: Diagnosis not present

## 2017-07-03 DIAGNOSIS — I5030 Unspecified diastolic (congestive) heart failure: Secondary | ICD-10-CM | POA: Diagnosis not present

## 2017-07-20 DIAGNOSIS — I5030 Unspecified diastolic (congestive) heart failure: Secondary | ICD-10-CM | POA: Diagnosis not present

## 2017-07-20 DIAGNOSIS — I129 Hypertensive chronic kidney disease with stage 1 through stage 4 chronic kidney disease, or unspecified chronic kidney disease: Secondary | ICD-10-CM | POA: Diagnosis not present

## 2017-07-20 DIAGNOSIS — N183 Chronic kidney disease, stage 3 (moderate): Secondary | ICD-10-CM | POA: Diagnosis not present

## 2017-07-20 DIAGNOSIS — D509 Iron deficiency anemia, unspecified: Secondary | ICD-10-CM | POA: Diagnosis not present

## 2017-07-20 DIAGNOSIS — I480 Paroxysmal atrial fibrillation: Secondary | ICD-10-CM | POA: Diagnosis not present

## 2019-11-30 DEATH — deceased
